# Patient Record
Sex: Male | Born: 1935 | Race: White | Hispanic: No | Marital: Married | Smoking: Smoker, current status unknown
Health system: Southern US, Community
[De-identification: ages and names within clinical notes are randomized; demographics above are authoritative.]

## PROBLEM LIST (undated history)

## (undated) DIAGNOSIS — C801 Malignant (primary) neoplasm, unspecified: Secondary | ICD-10-CM

## (undated) DIAGNOSIS — J189 Pneumonia, unspecified organism: Secondary | ICD-10-CM

## (undated) DIAGNOSIS — C4490 Unspecified malignant neoplasm of skin, unspecified: Secondary | ICD-10-CM

## (undated) DIAGNOSIS — C49A0 Gastrointestinal stromal tumor, unspecified site: Secondary | ICD-10-CM

## (undated) DIAGNOSIS — C719 Malignant neoplasm of brain, unspecified: Secondary | ICD-10-CM

## (undated) DIAGNOSIS — I2699 Other pulmonary embolism without acute cor pulmonale: Secondary | ICD-10-CM

## (undated) HISTORY — PX: OTHER SURGICAL HISTORY: SHX169

## (undated) HISTORY — PX: MOLE REMOVAL: SHX2046

## (undated) HISTORY — PX: ESOPHAGOGASTRODUODENOSCOPY ENDOSCOPY: SHX5814

## (undated) HISTORY — PX: VASECTOMY: SHX75

---

## 2012-06-02 ENCOUNTER — Encounter: Payer: Self-pay | Admitting: Internal Medicine

## 2012-06-28 ENCOUNTER — Encounter: Payer: Self-pay | Admitting: Internal Medicine

## 2012-06-28 LAB — CBC WITH DIFFERENTIAL/PLATELET
Comment - H1-Com2: NORMAL
Eosinophil: 2 %
HGB: 9.8 g/dL — ABNORMAL LOW (ref 13.0–18.0)
Lymphocytes: 29 %
MCH: 35.2 pg — ABNORMAL HIGH (ref 26.0–34.0)
MCV: 100 fL (ref 80–100)
Monocytes: 9 %
Platelet: 31 10*3/uL — ABNORMAL LOW (ref 150–440)
RBC: 2.8 10*6/uL — ABNORMAL LOW (ref 4.40–5.90)
WBC: 1.7 10*3/uL — CL (ref 3.8–10.6)

## 2012-06-28 LAB — CLOSTRIDIUM DIFFICILE BY PCR

## 2012-06-30 LAB — CBC WITH DIFFERENTIAL/PLATELET
Basophil #: 0 10*3/uL (ref 0.0–0.1)
Basophil %: 0.4 %
Eosinophil #: 0.1 10*3/uL (ref 0.0–0.7)
Eosinophil %: 2.2 %
HCT: 27.8 % — ABNORMAL LOW (ref 40.0–52.0)
Lymphocyte %: 24.9 %
MCHC: 34.7 g/dL (ref 32.0–36.0)
MCV: 100 fL (ref 80–100)
Monocyte %: 8 %
Neutrophil #: 1.8 10*3/uL (ref 1.4–6.5)
RDW: 19.1 % — ABNORMAL HIGH (ref 11.5–14.5)

## 2012-07-06 LAB — CBC WITH DIFFERENTIAL/PLATELET
Eosinophil: 3 %
HGB: 9 g/dL — ABNORMAL LOW (ref 13.0–18.0)
MCH: 34.7 pg — ABNORMAL HIGH (ref 26.0–34.0)
MCV: 99 fL (ref 80–100)
Monocytes: 9 %
Myelocyte: 3 %
Other Cells Blood: 2
RBC: 2.59 10*6/uL — ABNORMAL LOW (ref 4.40–5.90)

## 2012-07-07 ENCOUNTER — Inpatient Hospital Stay: Payer: Self-pay | Admitting: Internal Medicine

## 2012-07-07 LAB — URINALYSIS, COMPLETE
Bacteria: NONE SEEN
Blood: NEGATIVE
Glucose,UR: NEGATIVE mg/dL (ref 0–75)
Nitrite: NEGATIVE
Protein: NEGATIVE
Specific Gravity: 1.011 (ref 1.003–1.030)
Squamous Epithelial: NONE SEEN

## 2012-07-07 LAB — COMPREHENSIVE METABOLIC PANEL
Albumin: 2.3 g/dL — ABNORMAL LOW (ref 3.4–5.0)
Alkaline Phosphatase: 118 U/L (ref 50–136)
Anion Gap: 10 (ref 7–16)
BUN: 31 mg/dL — ABNORMAL HIGH (ref 7–18)
Bilirubin,Total: 0.3 mg/dL (ref 0.2–1.0)
EGFR (Non-African Amer.): 40 — ABNORMAL LOW
Glucose: 153 mg/dL — ABNORMAL HIGH (ref 65–99)
Osmolality: 282 (ref 275–301)
Potassium: 4.1 mmol/L (ref 3.5–5.1)
SGOT(AST): 24 U/L (ref 15–37)
SGPT (ALT): 17 U/L (ref 12–78)
Sodium: 136 mmol/L (ref 136–145)
Total Protein: 6.4 g/dL (ref 6.4–8.2)

## 2012-07-07 LAB — CBC WITH DIFFERENTIAL/PLATELET
Bands: 9 %
Basophil: 0 %
Blast: 0 %
Eosinophil: 2 %
Eosinophil: 1 %
HCT: 26.8 % — ABNORMAL LOW (ref 40.0–52.0)
HGB: 9.2 g/dL — ABNORMAL LOW (ref 13.0–18.0)
HGB: 8.4 g/dL — ABNORMAL LOW (ref 13.0–18.0)
Lymphocytes: 9 %
MCHC: 34.4 g/dL (ref 32.0–36.0)
MCHC: 34.5 g/dL (ref 32.0–36.0)
MCV: 99 fL (ref 80–100)
Metamyelocyte: 2 %
Metamyelocyte: 2 %
Monocytes: 19 %
Monocytes: 15 %
NRBC/100 WBC: 0 /
Platelet: 196 10*3/uL (ref 150–440)
Promyelocyte: 0 %
RBC: 2.7 10*6/uL — ABNORMAL LOW (ref 4.40–5.90)
RBC: 2.46 10*6/uL — ABNORMAL LOW (ref 4.40–5.90)
RDW: 19.4 % — ABNORMAL HIGH (ref 11.5–14.5)
Segmented Neutrophils: 61 %
Segmented Neutrophils: 60 %
Variant Lymphocyte - H1-Rlymph: 1 %
Variant Lymphocyte - H1-Rlymph: 4 %
WBC: 7.3 10*3/uL (ref 3.8–10.6)
WBC: 8.6 10*3/uL (ref 3.8–10.6)

## 2012-07-07 LAB — LIPASE, BLOOD: Lipase: 89 U/L (ref 73–393)

## 2012-07-07 LAB — SEDIMENTATION RATE: Erythrocyte Sed Rate: >140 mm/hr — ABNORMAL HIGH (ref 0–20)

## 2012-07-07 LAB — TSH: Thyroid Stimulating Horm: 2.07 u[IU]/mL

## 2012-07-07 LAB — TROPONIN I: Troponin-I: 0.13 ng/mL — ABNORMAL HIGH

## 2012-07-08 LAB — TROPONIN I
Troponin-I: 0.03 ng/mL
Troponin-I: 0.1 ng/mL — ABNORMAL HIGH

## 2012-07-08 LAB — COMPREHENSIVE METABOLIC PANEL
Anion Gap: 8 (ref 7–16)
BUN: 26 mg/dL — ABNORMAL HIGH (ref 7–18)
Bilirubin,Total: 0.3 mg/dL (ref 0.2–1.0)
Chloride: 106 mmol/L (ref 98–107)
Co2: 23 mmol/L (ref 21–32)
EGFR (African American): 46 — ABNORMAL LOW
EGFR (Non-African Amer.): 40 — ABNORMAL LOW
Potassium: 3.9 mmol/L (ref 3.5–5.1)
SGOT(AST): 23 U/L (ref 15–37)
SGPT (ALT): 15 U/L (ref 12–78)
Total Protein: 5.7 g/dL — ABNORMAL LOW (ref 6.4–8.2)

## 2012-07-08 LAB — CK TOTAL AND CKMB (NOT AT ARMC)
CK, Total: 33 U/L — ABNORMAL LOW (ref 35–232)
CK-MB: 1.2 ng/mL (ref 0.5–3.6)

## 2012-07-08 LAB — CBC WITH DIFFERENTIAL/PLATELET
Basophil #: 0 10*3/uL (ref 0.0–0.1)
Basophil %: 0.4 %
Eosinophil %: 0.8 %
HCT: 21 % — ABNORMAL LOW (ref 40.0–52.0)
Lymphocyte #: 0.8 10*3/uL — ABNORMAL LOW (ref 1.0–3.6)
Lymphocyte %: 10.7 %
Monocyte %: 17.6 %
Neutrophil %: 70.5 %
Platelet: 201 10*3/uL (ref 150–440)
RBC: 2.12 10*6/uL — ABNORMAL LOW (ref 4.40–5.90)
RDW: 19.4 % — ABNORMAL HIGH (ref 11.5–14.5)
WBC: 7 10*3/uL (ref 3.8–10.6)

## 2012-07-09 LAB — CBC WITH DIFFERENTIAL/PLATELET
Basophil #: 0 10*3/uL (ref 0.0–0.1)
Basophil %: 0.4 %
Eosinophil %: 2.1 %
HGB: 6.9 g/dL — ABNORMAL LOW (ref 13.0–18.0)
Lymphocyte #: 0.6 10*3/uL — ABNORMAL LOW (ref 1.0–3.6)
Lymphocyte %: 10 %
MCH: 35 pg — ABNORMAL HIGH (ref 26.0–34.0)
MCV: 100 fL (ref 80–100)
Monocyte %: 13.6 %
Neutrophil %: 73.9 %
RBC: 1.98 10*6/uL — ABNORMAL LOW (ref 4.40–5.90)
RDW: 19.4 % — ABNORMAL HIGH (ref 11.5–14.5)
WBC: 6.1 10*3/uL (ref 3.8–10.6)

## 2012-07-09 LAB — BASIC METABOLIC PANEL
Calcium, Total: 8.5 mg/dL (ref 8.5–10.1)
Chloride: 108 mmol/L — ABNORMAL HIGH (ref 98–107)
Co2: 24 mmol/L (ref 21–32)
Creatinine: 1.51 mg/dL — ABNORMAL HIGH (ref 0.60–1.30)
EGFR (African American): 51 — ABNORMAL LOW
Potassium: 4 mmol/L (ref 3.5–5.1)
Sodium: 139 mmol/L (ref 136–145)

## 2012-07-09 LAB — IRON AND TIBC
Iron Bind.Cap.(Total): 152 ug/dL — ABNORMAL LOW (ref 250–450)
Iron Saturation: 28 %
Unbound Iron-Bind.Cap.: 110 ug/dL

## 2012-07-09 LAB — URINE CULTURE

## 2012-07-10 ENCOUNTER — Encounter: Payer: Self-pay | Admitting: Internal Medicine

## 2012-07-10 LAB — VANCOMYCIN, TROUGH: Vancomycin, Trough: 13 ug/mL (ref 10–20)

## 2012-07-10 LAB — BASIC METABOLIC PANEL
BUN: 17 mg/dL (ref 7–18)
Calcium, Total: 8.3 mg/dL — ABNORMAL LOW (ref 8.5–10.1)
Creatinine: 1.43 mg/dL — ABNORMAL HIGH (ref 0.60–1.30)
EGFR (African American): 55 — ABNORMAL LOW
Glucose: 97 mg/dL (ref 65–99)
Osmolality: 279 (ref 275–301)
Potassium: 4 mmol/L (ref 3.5–5.1)
Sodium: 139 mmol/L (ref 136–145)

## 2012-07-10 LAB — CBC WITH DIFFERENTIAL/PLATELET
Bands: 3 %
Eosinophil: 3 %
HGB: 7.3 g/dL — ABNORMAL LOW (ref 13.0–18.0)
MCH: 34.9 pg — ABNORMAL HIGH (ref 26.0–34.0)
MCHC: 35.2 g/dL (ref 32.0–36.0)
Monocytes: 7 %
RDW: 19.2 % — ABNORMAL HIGH (ref 11.5–14.5)
Segmented Neutrophils: 78 %

## 2012-07-11 LAB — CBC WITH DIFFERENTIAL/PLATELET
Basophil #: 0 10*3/uL (ref 0.0–0.1)
Basophil %: 0.4 %
Eosinophil #: 0.1 10*3/uL (ref 0.0–0.7)
HCT: 23.4 % — ABNORMAL LOW (ref 40.0–52.0)
MCH: 33.6 pg (ref 26.0–34.0)
MCHC: 35.2 g/dL (ref 32.0–36.0)
Monocyte #: 1 x10 3/mm (ref 0.2–1.0)
Monocyte %: 15 %
Neutrophil #: 4.9 10*3/uL (ref 1.4–6.5)
Neutrophil %: 72.2 %
Platelet: 284 10*3/uL (ref 150–440)
RDW: 20.2 % — ABNORMAL HIGH (ref 11.5–14.5)
WBC: 6.7 10*3/uL (ref 3.8–10.6)

## 2012-07-11 LAB — BASIC METABOLIC PANEL
Anion Gap: 10 (ref 7–16)
Calcium, Total: 8.6 mg/dL (ref 8.5–10.1)
Chloride: 108 mmol/L — ABNORMAL HIGH (ref 98–107)
Co2: 21 mmol/L (ref 21–32)
EGFR (Non-African Amer.): 47 — ABNORMAL LOW
Glucose: 96 mg/dL (ref 65–99)
Osmolality: 279 (ref 275–301)
Potassium: 3.6 mmol/L (ref 3.5–5.1)
Sodium: 139 mmol/L (ref 136–145)

## 2012-07-12 LAB — BASIC METABOLIC PANEL
Anion Gap: 10 (ref 7–16)
Calcium, Total: 8.5 mg/dL (ref 8.5–10.1)
Chloride: 107 mmol/L (ref 98–107)
Co2: 22 mmol/L (ref 21–32)
Creatinine: 1.35 mg/dL — ABNORMAL HIGH (ref 0.60–1.30)
EGFR (African American): 59 — ABNORMAL LOW
EGFR (Non-African Amer.): 51 — ABNORMAL LOW
Glucose: 106 mg/dL — ABNORMAL HIGH (ref 65–99)
Osmolality: 279 (ref 275–301)
Potassium: 3.5 mmol/L (ref 3.5–5.1)
Sodium: 139 mmol/L (ref 136–145)

## 2012-07-12 LAB — CBC WITH DIFFERENTIAL/PLATELET
Basophil %: 0.3 %
Eosinophil %: 1.7 %
HCT: 23.6 % — ABNORMAL LOW (ref 40.0–52.0)
HGB: 8.1 g/dL — ABNORMAL LOW (ref 13.0–18.0)
Lymphocyte %: 8.5 %
MCH: 32.9 pg (ref 26.0–34.0)
MCHC: 34.2 g/dL (ref 32.0–36.0)
Monocyte %: 14.5 %
Neutrophil #: 6.1 10*3/uL (ref 1.4–6.5)
Neutrophil %: 75 %
RBC: 2.46 10*6/uL — ABNORMAL LOW (ref 4.40–5.90)

## 2012-07-13 LAB — CULTURE, BLOOD (SINGLE)

## 2012-07-22 ENCOUNTER — Encounter: Payer: Self-pay | Admitting: Internal Medicine

## 2012-07-26 LAB — CBC WITH DIFFERENTIAL/PLATELET
Basophil #: 0 10*3/uL (ref 0.0–0.1)
Basophil %: 0.3 %
Eosinophil #: 0.5 10*3/uL (ref 0.0–0.7)
Eosinophil %: 9.1 %
HCT: 30 % — ABNORMAL LOW (ref 40.0–52.0)
HGB: 10.4 g/dL — ABNORMAL LOW (ref 13.0–18.0)
Lymphocyte #: 0.4 10*3/uL — ABNORMAL LOW (ref 1.0–3.6)
Lymphocyte %: 8.5 %
MCH: 32.2 pg (ref 26.0–34.0)
MCHC: 34.6 g/dL (ref 32.0–36.0)
Neutrophil #: 4 10*3/uL (ref 1.4–6.5)
RDW: 17.8 % — ABNORMAL HIGH (ref 11.5–14.5)

## 2012-07-26 LAB — BASIC METABOLIC PANEL
BUN: 18 mg/dL (ref 7–18)
Co2: 24 mmol/L (ref 21–32)
Glucose: 113 mg/dL — ABNORMAL HIGH (ref 65–99)
Osmolality: 280 (ref 275–301)
Potassium: 3.4 mmol/L — ABNORMAL LOW (ref 3.5–5.1)
Sodium: 139 mmol/L (ref 136–145)

## 2012-07-29 LAB — BASIC METABOLIC PANEL
Anion Gap: 14 (ref 7–16)
Calcium, Total: 9.3 mg/dL (ref 8.5–10.1)
Co2: 20 mmol/L — ABNORMAL LOW (ref 21–32)
Creatinine: 1.22 mg/dL (ref 0.60–1.30)
EGFR (African American): >60
EGFR (Non-African Amer.): 57 — ABNORMAL LOW
Glucose: 113 mg/dL — ABNORMAL HIGH (ref 65–99)
Osmolality: 281 (ref 275–301)
Sodium: 140 mmol/L (ref 136–145)

## 2012-07-29 LAB — CBC WITH DIFFERENTIAL/PLATELET
Basophil %: 0.7 %
Eosinophil #: 0.5 10*3/uL (ref 0.0–0.7)
Eosinophil %: 11.9 %
HCT: 29.6 % — ABNORMAL LOW (ref 40.0–52.0)
Lymphocyte %: 10.8 %
MCHC: 34.2 g/dL (ref 32.0–36.0)
MCV: 95 fL (ref 80–100)
Monocyte %: 11.3 %
Neutrophil %: 65.3 %
Platelet: 61 10*3/uL — ABNORMAL LOW (ref 150–440)
RDW: 18.7 % — ABNORMAL HIGH (ref 11.5–14.5)
WBC: 4.5 10*3/uL (ref 3.8–10.6)

## 2012-08-02 LAB — CBC WITH DIFFERENTIAL/PLATELET
Bands: 2 %
Basophil: 1 %
MCH: 32.1 pg (ref 26.0–34.0)
MCHC: 34.1 g/dL (ref 32.0–36.0)
Monocytes: 9 %
Myelocyte: 1 %
RDW: 19.3 % — ABNORMAL HIGH (ref 11.5–14.5)
Segmented Neutrophils: 38 %

## 2012-08-02 LAB — BASIC METABOLIC PANEL
Anion Gap: 9 (ref 7–16)
BUN: 19 mg/dL — ABNORMAL HIGH (ref 7–18)
Calcium, Total: 9.6 mg/dL (ref 8.5–10.1)
Chloride: 106 mmol/L (ref 98–107)
Co2: 24 mmol/L (ref 21–32)
EGFR (African American): >60
EGFR (Non-African Amer.): >60
Osmolality: 279 (ref 275–301)

## 2012-08-05 LAB — CBC WITH DIFFERENTIAL/PLATELET
Bands: 1 %
MCH: 32.3 pg (ref 26.0–34.0)
MCHC: 34.2 g/dL (ref 32.0–36.0)
Platelet: 204 10*3/uL (ref 150–440)
RDW: 19.4 % — ABNORMAL HIGH (ref 11.5–14.5)
Segmented Neutrophils: 41 %
Variant Lymphocyte - H1-Rlymph: 15 %
WBC: 4 10*3/uL (ref 3.8–10.6)

## 2012-08-05 LAB — BASIC METABOLIC PANEL
Calcium, Total: 9.7 mg/dL (ref 8.5–10.1)
Creatinine: 1.17 mg/dL (ref 0.60–1.30)
EGFR (Non-African Amer.): >60
Glucose: 102 mg/dL — ABNORMAL HIGH (ref 65–99)
Osmolality: 280 (ref 275–301)
Potassium: 3.9 mmol/L (ref 3.5–5.1)
Sodium: 139 mmol/L (ref 136–145)

## 2012-08-09 ENCOUNTER — Encounter: Payer: Self-pay | Admitting: Internal Medicine

## 2012-08-09 LAB — CBC WITH DIFFERENTIAL/PLATELET
MCH: 32.5 pg (ref 26.0–34.0)
MCHC: 34 g/dL (ref 32.0–36.0)
MCV: 95 fL (ref 80–100)
Metamyelocyte: 1 %
Platelet: 213 10*3/uL (ref 150–440)
RDW: 20.1 % — ABNORMAL HIGH (ref 11.5–14.5)
Segmented Neutrophils: 62 %

## 2012-08-09 LAB — BASIC METABOLIC PANEL
BUN: 20 mg/dL — ABNORMAL HIGH (ref 7–18)
Calcium, Total: 9.8 mg/dL (ref 8.5–10.1)
Chloride: 105 mmol/L (ref 98–107)
Co2: 24 mmol/L (ref 21–32)
EGFR (African American): >60
Potassium: 3.9 mmol/L (ref 3.5–5.1)
Sodium: 140 mmol/L (ref 136–145)

## 2012-08-10 ENCOUNTER — Inpatient Hospital Stay: Payer: Self-pay | Admitting: Internal Medicine

## 2012-08-10 LAB — CBC WITH DIFFERENTIAL/PLATELET
Basophil #: 0.1 10*3/uL (ref 0.0–0.1)
Lymphocyte #: 1.1 10*3/uL (ref 1.0–3.6)
Lymphocyte %: 14.5 %
MCHC: 34.9 g/dL (ref 32.0–36.0)
MCV: 95 fL (ref 80–100)
Monocyte %: 15.1 %
Neutrophil %: 66.1 %
Platelet: 219 10*3/uL (ref 150–440)
RBC: 3.02 10*6/uL — ABNORMAL LOW (ref 4.40–5.90)
RDW: 20 % — ABNORMAL HIGH (ref 11.5–14.5)
WBC: 7.5 10*3/uL (ref 3.8–10.6)

## 2012-08-10 LAB — COMPREHENSIVE METABOLIC PANEL
BUN: 20 mg/dL — ABNORMAL HIGH (ref 7–18)
Bilirubin,Total: 0.3 mg/dL (ref 0.2–1.0)
Chloride: 109 mmol/L — ABNORMAL HIGH (ref 98–107)
Co2: 23 mmol/L (ref 21–32)
Creatinine: 1.05 mg/dL (ref 0.60–1.30)
EGFR (African American): >60
EGFR (Non-African Amer.): >60
Glucose: 114 mg/dL — ABNORMAL HIGH (ref 65–99)
Osmolality: 283 (ref 275–301)
Potassium: 4.1 mmol/L (ref 3.5–5.1)
SGPT (ALT): 21 U/L (ref 12–78)
Sodium: 140 mmol/L (ref 136–145)
Total Protein: 6 g/dL — ABNORMAL LOW (ref 6.4–8.2)

## 2012-08-10 LAB — CK TOTAL AND CKMB (NOT AT ARMC): CK, Total: 22 U/L — ABNORMAL LOW (ref 35–232)

## 2012-08-10 LAB — PROTIME-INR: INR: 1.8

## 2012-08-11 LAB — BASIC METABOLIC PANEL
Anion Gap: 10 (ref 7–16)
BUN: 18 mg/dL (ref 7–18)
Calcium, Total: 8.6 mg/dL (ref 8.5–10.1)
Chloride: 108 mmol/L — ABNORMAL HIGH (ref 98–107)
Co2: 23 mmol/L (ref 21–32)
Creatinine: 1.28 mg/dL (ref 0.60–1.30)
EGFR (African American): >60
Potassium: 4.4 mmol/L (ref 3.5–5.1)

## 2012-08-11 LAB — CBC WITH DIFFERENTIAL/PLATELET
Basophil #: 0 10*3/uL (ref 0.0–0.1)
Basophil %: 0.4 %
Eosinophil %: 5.3 %
HCT: 26.9 % — ABNORMAL LOW (ref 40.0–52.0)
HGB: 9.7 g/dL — ABNORMAL LOW (ref 13.0–18.0)
Lymphocyte #: 0.9 10*3/uL — ABNORMAL LOW (ref 1.0–3.6)
MCH: 34 pg (ref 26.0–34.0)
MCV: 95 fL (ref 80–100)
Monocyte %: 17.1 %
Neutrophil %: 60.2 %
Platelet: 211 10*3/uL (ref 150–440)
RBC: 2.85 10*6/uL — ABNORMAL LOW (ref 4.40–5.90)
WBC: 5.3 10*3/uL (ref 3.8–10.6)

## 2012-08-11 LAB — APTT: Activated PTT: 79 secs — ABNORMAL HIGH (ref 23.6–35.9)

## 2012-08-11 LAB — PROTIME-INR
INR: 1
Prothrombin Time: 13.8 secs (ref 11.5–14.7)

## 2012-08-12 LAB — APTT: Activated PTT: 86.8 secs — ABNORMAL HIGH (ref 23.6–35.9)

## 2012-08-16 LAB — CBC WITH DIFFERENTIAL/PLATELET
Basophil %: 0.8 %
Eosinophil #: 0.2 10*3/uL (ref 0.0–0.7)
Eosinophil %: 3.7 %
HGB: 10.2 g/dL — ABNORMAL LOW (ref 13.0–18.0)
Lymphocyte #: 0.8 10*3/uL — ABNORMAL LOW (ref 1.0–3.6)
MCH: 32.7 pg (ref 26.0–34.0)
MCHC: 34.3 g/dL (ref 32.0–36.0)
MCV: 95 fL (ref 80–100)
Monocyte #: 0.7 x10 3/mm (ref 0.2–1.0)
Platelet: 314 10*3/uL (ref 150–440)
RBC: 3.12 10*6/uL — ABNORMAL LOW (ref 4.40–5.90)

## 2012-08-23 LAB — PROTIME-INR
INR: 1.4
Prothrombin Time: 17.8 secs — ABNORMAL HIGH (ref 11.5–14.7)

## 2012-08-23 LAB — BASIC METABOLIC PANEL
BUN: 20 mg/dL — ABNORMAL HIGH (ref 7–18)
Chloride: 107 mmol/L (ref 98–107)
Creatinine: 1.1 mg/dL (ref 0.60–1.30)
EGFR (African American): >60
EGFR (Non-African Amer.): >60
Glucose: 82 mg/dL (ref 65–99)
Osmolality: 285 (ref 275–301)
Potassium: 3.9 mmol/L (ref 3.5–5.1)
Sodium: 142 mmol/L (ref 136–145)

## 2012-08-24 LAB — CBC WITH DIFFERENTIAL/PLATELET
Basophil #: 0 10*3/uL (ref 0.0–0.1)
Basophil %: 0.7 %
Eosinophil #: 0.5 10*3/uL (ref 0.0–0.7)
Eosinophil %: 8 %
HCT: 31.2 % — ABNORMAL LOW (ref 40.0–52.0)
HGB: 10.5 g/dL — ABNORMAL LOW (ref 13.0–18.0)
Lymphocyte #: 0.8 10*3/uL — ABNORMAL LOW (ref 1.0–3.6)
Lymphocyte %: 13.9 %
MCH: 32.5 pg (ref 26.0–34.0)
MCHC: 33.5 g/dL (ref 32.0–36.0)
MCV: 97 fL (ref 80–100)
Monocyte #: 0.7 x10 3/mm (ref 0.2–1.0)
Monocyte %: 11.1 %
Neutrophil #: 4 10*3/uL (ref 1.4–6.5)
Neutrophil %: 66.3 %
Platelet: 289 10*3/uL (ref 150–440)
RBC: 3.23 10*6/uL — ABNORMAL LOW (ref 4.40–5.90)
RDW: 19.4 % — ABNORMAL HIGH (ref 11.5–14.5)
WBC: 6 10*3/uL (ref 3.8–10.6)

## 2012-08-26 LAB — CBC WITH DIFFERENTIAL/PLATELET
HCT: 31.7 % — ABNORMAL LOW (ref 40.0–52.0)
Lymphocytes: 16 %
MCH: 33.4 pg (ref 26.0–34.0)
MCHC: 34.2 g/dL (ref 32.0–36.0)
MCV: 98 fL (ref 80–100)
Monocytes: 13 %
Platelet: 274 10*3/uL (ref 150–440)
RDW: 19.5 % — ABNORMAL HIGH (ref 11.5–14.5)
Segmented Neutrophils: 54 %
Variant Lymphocyte - H1-Rlymph: 4 %
WBC: 5.1 10*3/uL (ref 3.8–10.6)

## 2012-08-26 LAB — BASIC METABOLIC PANEL
Anion Gap: 13 (ref 7–16)
BUN: 20 mg/dL — ABNORMAL HIGH (ref 7–18)
Chloride: 104 mmol/L (ref 98–107)
Creatinine: 1.2 mg/dL (ref 0.60–1.30)
EGFR (African American): >60
EGFR (Non-African Amer.): 58 — ABNORMAL LOW
Glucose: 68 mg/dL (ref 65–99)

## 2012-08-31 ENCOUNTER — Inpatient Hospital Stay: Payer: Self-pay | Admitting: Internal Medicine

## 2012-08-31 LAB — COMPREHENSIVE METABOLIC PANEL
Albumin: 2.9 g/dL — ABNORMAL LOW (ref 3.4–5.0)
Anion Gap: 11 (ref 7–16)
BUN: 26 mg/dL — ABNORMAL HIGH (ref 7–18)
Calcium, Total: 8.2 mg/dL — ABNORMAL LOW (ref 8.5–10.1)
Co2: 20 mmol/L — ABNORMAL LOW (ref 21–32)
EGFR (Non-African Amer.): >60
Glucose: 118 mg/dL — ABNORMAL HIGH (ref 65–99)
Osmolality: 295 (ref 275–301)
Potassium: 3.7 mmol/L (ref 3.5–5.1)
SGOT(AST): 41 U/L — ABNORMAL HIGH (ref 15–37)
Sodium: 145 mmol/L (ref 136–145)
Total Protein: 6 g/dL — ABNORMAL LOW (ref 6.4–8.2)

## 2012-08-31 LAB — LIPASE, BLOOD: Lipase: 110 U/L (ref 73–393)

## 2012-08-31 LAB — CBC WITH DIFFERENTIAL/PLATELET
Basophil #: 0.1 10*3/uL (ref 0.0–0.1)
Basophil %: 1.1 %
Eosinophil #: 0 10*3/uL (ref 0.0–0.7)
HGB: 9.9 g/dL — ABNORMAL LOW (ref 13.0–18.0)
Lymphocyte %: 6.7 %
MCH: 33.5 pg (ref 26.0–34.0)
MCHC: 33.5 g/dL (ref 32.0–36.0)
Monocyte %: 2 %
Neutrophil %: 90.2 %
Platelet: 207 10*3/uL (ref 150–440)
WBC: 6.6 10*3/uL (ref 3.8–10.6)

## 2012-08-31 LAB — TROPONIN I: Troponin-I: 0.04 ng/mL

## 2012-08-31 LAB — PRO B NATRIURETIC PEPTIDE: B-Type Natriuretic Peptide: 5546 pg/mL — ABNORMAL HIGH (ref 0–450)

## 2012-09-01 LAB — BASIC METABOLIC PANEL
Anion Gap: 12 (ref 7–16)
BUN: 28 mg/dL — ABNORMAL HIGH (ref 7–18)
Calcium, Total: 8.6 mg/dL (ref 8.5–10.1)
Chloride: 110 mmol/L — ABNORMAL HIGH (ref 98–107)
Co2: 20 mmol/L — ABNORMAL LOW (ref 21–32)
Creatinine: 1.07 mg/dL (ref 0.60–1.30)
Osmolality: 288 (ref 275–301)
Potassium: 3.2 mmol/L — ABNORMAL LOW (ref 3.5–5.1)
Sodium: 142 mmol/L (ref 136–145)

## 2012-09-01 LAB — CBC WITH DIFFERENTIAL/PLATELET
Basophil %: 0.4 %
Eosinophil #: 0.2 10*3/uL (ref 0.0–0.7)
Eosinophil %: 4.2 %
HCT: 32.2 % — ABNORMAL LOW (ref 40.0–52.0)
HGB: 11 g/dL — ABNORMAL LOW (ref 13.0–18.0)
MCH: 33.2 pg (ref 26.0–34.0)
Monocyte %: 2 %
Neutrophil %: 82.7 %
Platelet: 202 10*3/uL (ref 150–440)
RBC: 3.31 10*6/uL — ABNORMAL LOW (ref 4.40–5.90)
RDW: 18.7 % — ABNORMAL HIGH (ref 11.5–14.5)
WBC: 5.8 10*3/uL (ref 3.8–10.6)

## 2012-09-01 LAB — LIPID PANEL
Cholesterol: 194 mg/dL (ref 0–200)
HDL Cholesterol: 34 mg/dL — ABNORMAL LOW (ref 40–60)
Triglycerides: 300 mg/dL — ABNORMAL HIGH (ref 0–200)

## 2012-09-02 LAB — CBC WITH DIFFERENTIAL/PLATELET
Basophil #: 0 10*3/uL (ref 0.0–0.1)
Eosinophil #: 0.3 10*3/uL (ref 0.0–0.7)
HCT: 31.5 % — ABNORMAL LOW (ref 40.0–52.0)
HGB: 10.7 g/dL — ABNORMAL LOW (ref 13.0–18.0)
Lymphocyte #: 0.6 10*3/uL — ABNORMAL LOW (ref 1.0–3.6)
MCH: 33 pg (ref 26.0–34.0)
MCV: 97 fL (ref 80–100)
Monocyte #: 0.2 x10 3/mm (ref 0.2–1.0)
Neutrophil %: 68.8 %
Platelet: 172 10*3/uL (ref 150–440)
RBC: 3.26 10*6/uL — ABNORMAL LOW (ref 4.40–5.90)
RDW: 18.2 % — ABNORMAL HIGH (ref 11.5–14.5)
WBC: 3.7 10*3/uL — ABNORMAL LOW (ref 3.8–10.6)

## 2012-09-02 LAB — BASIC METABOLIC PANEL
Anion Gap: 10 (ref 7–16)
BUN: 28 mg/dL — ABNORMAL HIGH (ref 7–18)
Calcium, Total: 8.7 mg/dL (ref 8.5–10.1)
Chloride: 109 mmol/L — ABNORMAL HIGH (ref 98–107)
Co2: 22 mmol/L (ref 21–32)
Creatinine: 1.14 mg/dL (ref 0.60–1.30)
EGFR (African American): >60
Potassium: 3.4 mmol/L — ABNORMAL LOW (ref 3.5–5.1)
Sodium: 141 mmol/L (ref 136–145)

## 2012-09-02 LAB — MAGNESIUM: Magnesium: 1.7 mg/dL — ABNORMAL LOW

## 2012-09-06 LAB — CBC WITH DIFFERENTIAL/PLATELET
Basophil #: 0 10*3/uL (ref 0.0–0.1)
Eosinophil #: 0.4 10*3/uL (ref 0.0–0.7)
Eosinophil %: 10.4 %
HCT: 33 % — ABNORMAL LOW (ref 40.0–52.0)
Lymphocyte %: 19 %
MCHC: 34.9 g/dL (ref 32.0–36.0)
Monocyte %: 13.1 %
Neutrophil #: 2.3 10*3/uL (ref 1.4–6.5)
Neutrophil %: 57.2 %
Platelet: 106 10*3/uL — ABNORMAL LOW (ref 150–440)
RDW: 17.8 % — ABNORMAL HIGH (ref 11.5–14.5)
WBC: 4 10*3/uL (ref 3.8–10.6)

## 2012-09-08 LAB — CBC WITH DIFFERENTIAL/PLATELET
Basophil #: 0 10*3/uL (ref 0.0–0.1)
Basophil %: 0.6 %
Eosinophil #: 0.3 10*3/uL (ref 0.0–0.7)
Eosinophil %: 7.5 %
HGB: 11.3 g/dL — ABNORMAL LOW (ref 13.0–18.0)
Lymphocyte %: 19.3 %
MCH: 33.5 pg (ref 26.0–34.0)
Monocyte %: 12.6 %
Neutrophil %: 60 %
Platelet: 117 10*3/uL — ABNORMAL LOW (ref 150–440)
RBC: 3.38 10*6/uL — ABNORMAL LOW (ref 4.40–5.90)
WBC: 4 10*3/uL (ref 3.8–10.6)

## 2012-09-08 LAB — BASIC METABOLIC PANEL
BUN: 30 mg/dL — ABNORMAL HIGH (ref 7–18)
Calcium, Total: 9.3 mg/dL (ref 8.5–10.1)
EGFR (Non-African Amer.): 57 — ABNORMAL LOW
Glucose: 90 mg/dL (ref 65–99)
Potassium: 4 mmol/L (ref 3.5–5.1)
Sodium: 142 mmol/L (ref 136–145)

## 2012-09-09 ENCOUNTER — Encounter: Payer: Self-pay | Admitting: Internal Medicine

## 2012-09-13 LAB — CBC WITH DIFFERENTIAL/PLATELET
Lymphocytes: 14 %
Monocytes: 13 %
Platelet: 214 10*3/uL (ref 150–440)
RBC: 3.76 10*6/uL — ABNORMAL LOW (ref 4.40–5.90)
RDW: 17.9 % — ABNORMAL HIGH (ref 11.5–14.5)
Segmented Neutrophils: 39 %
Variant Lymphocyte - H1-Rlymph: 21 %
WBC: 2.7 10*3/uL — ABNORMAL LOW (ref 3.8–10.6)

## 2012-09-13 LAB — BASIC METABOLIC PANEL
Chloride: 106 mmol/L (ref 98–107)
Co2: 25 mmol/L (ref 21–32)
EGFR (African American): 59 — ABNORMAL LOW
Glucose: 108 mg/dL — ABNORMAL HIGH (ref 65–99)
Sodium: 142 mmol/L (ref 136–145)

## 2012-09-15 LAB — CBC WITH DIFFERENTIAL/PLATELET
Bands: 2 %
HCT: 33.3 % — ABNORMAL LOW (ref 40.0–52.0)
MCH: 33.3 pg (ref 26.0–34.0)
MCHC: 34.1 g/dL (ref 32.0–36.0)
Platelet: 207 10*3/uL (ref 150–440)
RBC: 3.41 10*6/uL — ABNORMAL LOW (ref 4.40–5.90)
Variant Lymphocyte - H1-Rlymph: 5 %
WBC: 2.7 10*3/uL — ABNORMAL LOW (ref 3.8–10.6)

## 2012-09-15 LAB — BASIC METABOLIC PANEL
Anion Gap: 11 (ref 7–16)
BUN: 27 mg/dL — ABNORMAL HIGH (ref 7–18)
Chloride: 107 mmol/L (ref 98–107)
Creatinine: 1.28 mg/dL (ref 0.60–1.30)
EGFR (Non-African Amer.): 54 — ABNORMAL LOW
Glucose: 82 mg/dL (ref 65–99)
Osmolality: 289 (ref 275–301)
Potassium: 4.2 mmol/L (ref 3.5–5.1)
Sodium: 143 mmol/L (ref 136–145)

## 2012-09-18 ENCOUNTER — Emergency Department: Payer: Self-pay | Admitting: Emergency Medicine

## 2012-09-18 LAB — CBC WITH DIFFERENTIAL/PLATELET
Bands: 13 %
Basophil: 1 %
Eosinophil: 1 %
HGB: 11.8 g/dL — ABNORMAL LOW (ref 13.0–18.0)
MCHC: 34.5 g/dL (ref 32.0–36.0)
MCV: 98 fL (ref 80–100)
Metamyelocyte: 1 %
Monocytes: 12 %
Platelet: 211 10*3/uL (ref 150–440)
RBC: 3.48 10*6/uL — ABNORMAL LOW (ref 4.40–5.90)
Segmented Neutrophils: 57 %
Variant Lymphocyte - H1-Rlymph: 3 %

## 2012-09-18 LAB — URINALYSIS, COMPLETE
Bilirubin,UR: NEGATIVE
Ketone: NEGATIVE
Protein: 30
RBC,UR: 7 /HPF (ref 0–5)
Specific Gravity: 1.013 (ref 1.003–1.030)
Squamous Epithelial: <1
WBC UR: 255 /HPF (ref 0–5)

## 2012-09-18 LAB — COMPREHENSIVE METABOLIC PANEL
Albumin: 3.3 g/dL — ABNORMAL LOW (ref 3.4–5.0)
Anion Gap: 7 (ref 7–16)
Calcium, Total: 9.4 mg/dL (ref 8.5–10.1)
Co2: 26 mmol/L (ref 21–32)
EGFR (Non-African Amer.): 54 — ABNORMAL LOW
Potassium: 4.4 mmol/L (ref 3.5–5.1)
SGOT(AST): 29 U/L (ref 15–37)
SGPT (ALT): 33 U/L (ref 12–78)
Sodium: 134 mmol/L — ABNORMAL LOW (ref 136–145)
Total Protein: 6.8 g/dL (ref 6.4–8.2)

## 2012-09-20 LAB — BASIC METABOLIC PANEL
Anion Gap: 13 (ref 7–16)
Calcium, Total: 9.7 mg/dL (ref 8.5–10.1)
Co2: 21 mmol/L (ref 21–32)
EGFR (African American): 56 — ABNORMAL LOW
EGFR (Non-African Amer.): 48 — ABNORMAL LOW
Glucose: 146 mg/dL — ABNORMAL HIGH (ref 65–99)
Osmolality: 275 (ref 275–301)
Potassium: 3.7 mmol/L (ref 3.5–5.1)

## 2012-09-20 LAB — CBC WITH DIFFERENTIAL/PLATELET
Eosinophil: 5 %
HCT: 34.2 % — ABNORMAL LOW (ref 40.0–52.0)
HGB: 11.6 g/dL — ABNORMAL LOW (ref 13.0–18.0)
MCV: 98 fL (ref 80–100)
Metamyelocyte: 1 %
Monocytes: 8 %
Myelocyte: 1 %
Platelet: 224 10*3/uL (ref 150–440)
RBC: 3.47 10*6/uL — ABNORMAL LOW (ref 4.40–5.90)
Segmented Neutrophils: 72 %

## 2012-09-22 LAB — CBC WITH DIFFERENTIAL/PLATELET
Eosinophil: 2 %
Lymphocytes: 13 %
MCV: 99 fL (ref 80–100)
Metamyelocyte: 5 %
Monocytes: 4 %
Platelet: 260 10*3/uL (ref 150–440)
RBC: 3.61 10*6/uL — ABNORMAL LOW (ref 4.40–5.90)
Variant Lymphocyte - H1-Rlymph: 2 %
WBC: 5.2 10*3/uL (ref 3.8–10.6)

## 2012-09-22 LAB — BASIC METABOLIC PANEL
Anion Gap: 10 (ref 7–16)
Calcium, Total: 10.2 mg/dL — ABNORMAL HIGH (ref 8.5–10.1)
Chloride: 103 mmol/L (ref 98–107)
Co2: 25 mmol/L (ref 21–32)
EGFR (African American): >60
Glucose: 119 mg/dL — ABNORMAL HIGH (ref 65–99)
Osmolality: 279 (ref 275–301)
Sodium: 138 mmol/L (ref 136–145)

## 2012-09-24 LAB — CULTURE, BLOOD (SINGLE)

## 2012-09-29 LAB — URINALYSIS, COMPLETE
Bacteria: NONE SEEN
Blood: NEGATIVE
Leukocyte Esterase: NEGATIVE
Nitrite: NEGATIVE
Ph: 6 (ref 4.5–8.0)
Protein: NEGATIVE
RBC,UR: <1 /HPF (ref 0–5)
Squamous Epithelial: NONE SEEN

## 2012-09-29 LAB — BASIC METABOLIC PANEL
BUN: 24 mg/dL — ABNORMAL HIGH (ref 7–18)
Calcium, Total: 9.9 mg/dL (ref 8.5–10.1)
Chloride: 103 mmol/L (ref 98–107)
Co2: 25 mmol/L (ref 21–32)
Glucose: 97 mg/dL (ref 65–99)
Osmolality: 278 (ref 275–301)
Potassium: 4.4 mmol/L (ref 3.5–5.1)
Sodium: 137 mmol/L (ref 136–145)

## 2012-09-29 LAB — CBC WITH DIFFERENTIAL/PLATELET
Basophil #: 0 10*3/uL (ref 0.0–0.1)
Lymphocyte %: 14.6 %
Monocyte %: 11.1 %
Platelet: 345 10*3/uL (ref 150–440)
RDW: 16.5 % — ABNORMAL HIGH (ref 11.5–14.5)
WBC: 8.5 10*3/uL (ref 3.8–10.6)

## 2012-10-01 LAB — URINE CULTURE

## 2012-10-07 ENCOUNTER — Inpatient Hospital Stay: Payer: Self-pay | Admitting: Internal Medicine

## 2012-10-07 LAB — URINALYSIS, COMPLETE
Bacteria: NONE SEEN
Bilirubin,UR: NEGATIVE
Blood: NEGATIVE
Glucose,UR: NEGATIVE mg/dL (ref 0–75)
Hyaline Cast: 1
Leukocyte Esterase: NEGATIVE
Nitrite: NEGATIVE
Protein: NEGATIVE
RBC,UR: 1 /HPF (ref 0–5)
Specific Gravity: 1.012 (ref 1.003–1.030)
Squamous Epithelial: NONE SEEN
WBC UR: 1 /HPF (ref 0–5)

## 2012-10-07 LAB — CBC WITH DIFFERENTIAL/PLATELET
Bands: 7 %
Eosinophil: 5 %
HCT: 35 % — ABNORMAL LOW (ref 40.0–52.0)
HGB: 11.9 g/dL — ABNORMAL LOW (ref 13.0–18.0)
Lymphocytes: 9 %
MCHC: 34.1 g/dL (ref 32.0–36.0)
MCV: 97 fL (ref 80–100)
Monocytes: 10 %
RBC: 3.62 10*6/uL — ABNORMAL LOW (ref 4.40–5.90)
Segmented Neutrophils: 50 %
Variant Lymphocyte - H1-Rlymph: 12 %

## 2012-10-07 LAB — COMPREHENSIVE METABOLIC PANEL
Albumin: 3.1 g/dL — ABNORMAL LOW (ref 3.4–5.0)
Bilirubin,Total: 0.3 mg/dL (ref 0.2–1.0)
Calcium, Total: 9.7 mg/dL (ref 8.5–10.1)
Chloride: 104 mmol/L (ref 98–107)
Co2: 23 mmol/L (ref 21–32)
Creatinine: 1.3 mg/dL (ref 0.60–1.30)
EGFR (African American): >60
Potassium: 4.3 mmol/L (ref 3.5–5.1)
SGOT(AST): 33 U/L (ref 15–37)
SGPT (ALT): 39 U/L (ref 12–78)
Total Protein: 7.3 g/dL (ref 6.4–8.2)

## 2012-10-07 LAB — PROTIME-INR: INR: 1

## 2012-10-07 LAB — TROPONIN I: Troponin-I: <0.02 ng/mL

## 2012-10-08 LAB — BASIC METABOLIC PANEL
Anion Gap: 11 (ref 7–16)
BUN: 26 mg/dL — ABNORMAL HIGH (ref 7–18)
Calcium, Total: 9.6 mg/dL (ref 8.5–10.1)
Co2: 22 mmol/L (ref 21–32)
EGFR (African American): >60
EGFR (Non-African Amer.): 54 — ABNORMAL LOW
Glucose: 100 mg/dL — ABNORMAL HIGH (ref 65–99)
Potassium: 4.1 mmol/L (ref 3.5–5.1)

## 2012-10-08 LAB — CBC WITH DIFFERENTIAL/PLATELET
Eosinophil: 4 %
HCT: 32.4 % — ABNORMAL LOW (ref 40.0–52.0)
MCH: 33.3 pg (ref 26.0–34.0)
MCV: 98 fL (ref 80–100)
Metamyelocyte: 3 %
Platelet: 250 10*3/uL (ref 150–440)
RDW: 15.8 % — ABNORMAL HIGH (ref 11.5–14.5)
Segmented Neutrophils: 43 %
WBC: 5 10*3/uL (ref 3.8–10.6)

## 2012-10-09 ENCOUNTER — Encounter: Payer: Self-pay | Admitting: Internal Medicine

## 2012-10-09 LAB — CBC WITH DIFFERENTIAL/PLATELET
Eosinophil: 5 %
HCT: 26.6 % — ABNORMAL LOW (ref 40.0–52.0)
HGB: 9.3 g/dL — ABNORMAL LOW (ref 13.0–18.0)
Lymphocytes: 16 %
MCH: 34.1 pg — ABNORMAL HIGH (ref 26.0–34.0)
MCV: 97 fL (ref 80–100)
Metamyelocyte: 4 %
Monocytes: 13 %
Segmented Neutrophils: 52 %
Variant Lymphocyte - H1-Rlymph: 1 %
WBC: 3 10*3/uL — ABNORMAL LOW (ref 3.8–10.6)

## 2012-10-09 LAB — BASIC METABOLIC PANEL
BUN: 20 mg/dL — ABNORMAL HIGH (ref 7–18)
Co2: 22 mmol/L (ref 21–32)
EGFR (African American): >60
EGFR (Non-African Amer.): 54 — ABNORMAL LOW
Glucose: 91 mg/dL (ref 65–99)
Osmolality: 283 (ref 275–301)
Potassium: 3.7 mmol/L (ref 3.5–5.1)
Sodium: 141 mmol/L (ref 136–145)

## 2012-10-10 LAB — CULTURE, BLOOD (SINGLE)

## 2012-10-13 LAB — BASIC METABOLIC PANEL
Anion Gap: 9 (ref 7–16)
BUN: 19 mg/dL — ABNORMAL HIGH (ref 7–18)
Chloride: 105 mmol/L (ref 98–107)
Creatinine: 1.09 mg/dL (ref 0.60–1.30)
EGFR (African American): >60
Glucose: 86 mg/dL (ref 65–99)

## 2012-10-13 LAB — URINALYSIS, COMPLETE
Bacteria: NONE SEEN
Bilirubin,UR: NEGATIVE
Blood: NEGATIVE
Ketone: NEGATIVE
Leukocyte Esterase: NEGATIVE
Nitrite: NEGATIVE
RBC,UR: <1 /HPF (ref 0–5)
WBC UR: 1 /HPF (ref 0–5)

## 2012-10-13 LAB — CBC WITH DIFFERENTIAL/PLATELET
Bands: 1 %
Eosinophil: 14 %
HGB: 10.6 g/dL — ABNORMAL LOW (ref 13.0–18.0)
MCH: 32.6 pg (ref 26.0–34.0)
MCHC: 34 g/dL (ref 32.0–36.0)
Monocytes: 9 %
RBC: 3.25 10*6/uL — ABNORMAL LOW (ref 4.40–5.90)
RDW: 15.6 % — ABNORMAL HIGH (ref 11.5–14.5)
Segmented Neutrophils: 51 %

## 2012-10-27 LAB — CBC WITH DIFFERENTIAL/PLATELET
Bands: 4 %
HGB: 11.1 g/dL — ABNORMAL LOW (ref 13.0–18.0)
MCH: 32 pg (ref 26.0–34.0)
MCHC: 33.8 g/dL (ref 32.0–36.0)
MCV: 95 fL (ref 80–100)
Metamyelocyte: 1 %
Platelet: 297 10*3/uL (ref 150–440)
RDW: 15.3 % — ABNORMAL HIGH (ref 11.5–14.5)
Segmented Neutrophils: 54 %

## 2012-10-27 LAB — BASIC METABOLIC PANEL
Anion Gap: 8 (ref 7–16)
BUN: 22 mg/dL — ABNORMAL HIGH (ref 7–18)
Creatinine: 1.16 mg/dL (ref 0.60–1.30)
EGFR (African American): >60
Glucose: 91 mg/dL (ref 65–99)
Sodium: 138 mmol/L (ref 136–145)

## 2012-11-04 LAB — URINALYSIS, COMPLETE
Bilirubin,UR: NEGATIVE
Glucose,UR: NEGATIVE mg/dL (ref 0–75)
Leukocyte Esterase: NEGATIVE
Nitrite: NEGATIVE
Ph: 7 (ref 4.5–8.0)
Protein: NEGATIVE
RBC,UR: NONE SEEN /HPF (ref 0–5)
Squamous Epithelial: NONE SEEN
WBC UR: 1 /HPF (ref 0–5)

## 2012-11-04 LAB — CBC WITH DIFFERENTIAL/PLATELET
Eosinophil %: 6.1 %
HGB: 10.9 g/dL — ABNORMAL LOW (ref 13.0–18.0)
Lymphocyte #: 0.6 10*3/uL — ABNORMAL LOW (ref 1.0–3.6)
Lymphocyte %: 14.5 %
MCH: 31.3 pg (ref 26.0–34.0)
MCV: 95 fL (ref 80–100)
Neutrophil #: 2.6 10*3/uL (ref 1.4–6.5)
Neutrophil %: 60.3 %
RBC: 3.49 10*6/uL — ABNORMAL LOW (ref 4.40–5.90)

## 2012-11-04 LAB — COMPREHENSIVE METABOLIC PANEL
Alkaline Phosphatase: 119 U/L (ref 50–136)
Anion Gap: 9 (ref 7–16)
Bilirubin,Total: <0.1 mg/dL — ABNORMAL LOW (ref 0.2–1.0)
Calcium, Total: 9.1 mg/dL (ref 8.5–10.1)
Co2: 22 mmol/L (ref 21–32)
EGFR (African American): >60
Glucose: 84 mg/dL (ref 65–99)
Osmolality: 280 (ref 275–301)
Potassium: 4.2 mmol/L (ref 3.5–5.1)
SGOT(AST): 23 U/L (ref 15–37)
Sodium: 139 mmol/L (ref 136–145)

## 2012-11-09 ENCOUNTER — Encounter: Payer: Self-pay | Admitting: Internal Medicine

## 2012-11-10 LAB — COMPREHENSIVE METABOLIC PANEL
Albumin: 3.1 g/dL — ABNORMAL LOW (ref 3.4–5.0)
Alkaline Phosphatase: 114 U/L (ref 50–136)
Anion Gap: 12 (ref 7–16)
Bilirubin,Total: 0.3 mg/dL (ref 0.2–1.0)
Calcium, Total: 9.8 mg/dL (ref 8.5–10.1)
Chloride: 106 mmol/L (ref 98–107)
Co2: 21 mmol/L (ref 21–32)
EGFR (Non-African Amer.): 55 — ABNORMAL LOW
Glucose: 97 mg/dL (ref 65–99)
Osmolality: 282 (ref 275–301)
SGOT(AST): 22 U/L (ref 15–37)
SGPT (ALT): 22 U/L (ref 12–78)
Sodium: 139 mmol/L (ref 136–145)
Total Protein: 7.2 g/dL (ref 6.4–8.2)

## 2012-11-10 LAB — CBC WITH DIFFERENTIAL/PLATELET
Basophil #: 0 10*3/uL (ref 0.0–0.1)
Basophil %: 0.4 %
Eosinophil %: 4.1 %
HCT: 35.9 % — ABNORMAL LOW (ref 40.0–52.0)
Lymphocyte #: 0.8 10*3/uL — ABNORMAL LOW (ref 1.0–3.6)
Lymphocyte %: 17.8 %
MCH: 32.1 pg (ref 26.0–34.0)
MCHC: 33.8 g/dL (ref 32.0–36.0)
MCV: 95 fL (ref 80–100)
Monocyte %: 16.1 %
Neutrophil %: 61.6 %
Platelet: 299 10*3/uL (ref 150–440)
RDW: 15.4 % — ABNORMAL HIGH (ref 11.5–14.5)
WBC: 4.6 10*3/uL (ref 3.8–10.6)

## 2012-11-10 LAB — LACTATE DEHYDROGENASE: LDH: 150 U/L (ref 85–241)

## 2012-11-12 ENCOUNTER — Ambulatory Visit: Payer: Self-pay | Admitting: Internal Medicine

## 2012-11-15 LAB — CLOSTRIDIUM DIFFICILE BY PCR

## 2012-11-16 LAB — URINALYSIS, COMPLETE
Bacteria: NONE SEEN
Bilirubin,UR: NEGATIVE
Bilirubin,UR: NEGATIVE
Blood: NEGATIVE
Glucose,UR: NEGATIVE mg/dL (ref 0–75)
Ketone: NEGATIVE
Ketone: NEGATIVE
Leukocyte Esterase: NEGATIVE
Nitrite: NEGATIVE
Protein: NEGATIVE
RBC,UR: <1 /HPF (ref 0–5)
Specific Gravity: 1.009 (ref 1.003–1.030)
Specific Gravity: 1.008 (ref 1.003–1.030)
Squamous Epithelial: <1
WBC UR: <1 /HPF (ref 0–5)
WBC UR: <1 /HPF (ref 0–5)

## 2012-11-16 LAB — CBC WITH DIFFERENTIAL/PLATELET
Basophil #: 0 10*3/uL (ref 0.0–0.1)
Basophil %: 0.6 %
Eosinophil #: 0.2 10*3/uL (ref 0.0–0.7)
Eosinophil %: 4.4 %
HCT: 39.6 % — ABNORMAL LOW (ref 40.0–52.0)
Lymphocyte #: 0.7 10*3/uL — ABNORMAL LOW (ref 1.0–3.6)
Lymphocyte %: 13.6 %
Monocyte #: 0.6 x10 3/mm (ref 0.2–1.0)
Neutrophil #: 3.5 10*3/uL (ref 1.4–6.5)
Neutrophil %: 69.7 %
WBC: 5.1 10*3/uL (ref 3.8–10.6)

## 2012-11-24 LAB — COMPREHENSIVE METABOLIC PANEL
Albumin: 2.9 g/dL — ABNORMAL LOW (ref 3.4–5.0)
Alkaline Phosphatase: 102 U/L (ref 50–136)
Anion Gap: 10 (ref 7–16)
BUN: 23 mg/dL — ABNORMAL HIGH (ref 7–18)
Bilirubin,Total: 0.2 mg/dL (ref 0.2–1.0)
Calcium, Total: 9.3 mg/dL (ref 8.5–10.1)
Co2: 22 mmol/L (ref 21–32)
Creatinine: 1.28 mg/dL (ref 0.60–1.30)
EGFR (African American): >60
EGFR (Non-African Amer.): 54 — ABNORMAL LOW
Osmolality: 279 (ref 275–301)
Potassium: 3.9 mmol/L (ref 3.5–5.1)
SGPT (ALT): 23 U/L (ref 12–78)
Total Protein: 6.6 g/dL (ref 6.4–8.2)

## 2012-11-24 LAB — CBC WITH DIFFERENTIAL/PLATELET
Eosinophil: 9 %
HCT: 35.6 % — ABNORMAL LOW (ref 40.0–52.0)
HGB: 11.8 g/dL — ABNORMAL LOW (ref 13.0–18.0)
MCHC: 33.2 g/dL (ref 32.0–36.0)
Metamyelocyte: 3 %
Monocytes: 12 %
Platelet: 272 10*3/uL (ref 150–440)
RDW: 16 % — ABNORMAL HIGH (ref 11.5–14.5)
Segmented Neutrophils: 52 %
Variant Lymphocyte - H1-Rlymph: 3 %
WBC: 3.6 10*3/uL — ABNORMAL LOW (ref 3.8–10.6)

## 2012-12-08 LAB — COMPREHENSIVE METABOLIC PANEL
Albumin: 2.9 g/dL — ABNORMAL LOW (ref 3.4–5.0)
Anion Gap: 8 (ref 7–16)
BUN: 20 mg/dL — ABNORMAL HIGH (ref 7–18)
Bilirubin,Total: 0.2 mg/dL (ref 0.2–1.0)
Calcium, Total: 8.9 mg/dL (ref 8.5–10.1)
Chloride: 112 mmol/L — ABNORMAL HIGH (ref 98–107)
Co2: 21 mmol/L (ref 21–32)
Creatinine: 0.95 mg/dL (ref 0.60–1.30)
EGFR (Non-African Amer.): >60
Glucose: 122 mg/dL — ABNORMAL HIGH (ref 65–99)
Osmolality: 285 (ref 275–301)
Potassium: 3.8 mmol/L (ref 3.5–5.1)
SGOT(AST): 39 U/L — ABNORMAL HIGH (ref 15–37)
SGPT (ALT): 43 U/L (ref 12–78)
Sodium: 141 mmol/L (ref 136–145)

## 2012-12-08 LAB — CBC WITH DIFFERENTIAL/PLATELET
Bands: 5 %
HGB: 11 g/dL — ABNORMAL LOW (ref 13.0–18.0)
Lymphocytes: 23 %
MCHC: 32.1 g/dL (ref 32.0–36.0)
MCV: 95 fL (ref 80–100)
Myelocyte: 1 %
RDW: 16.9 % — ABNORMAL HIGH (ref 11.5–14.5)
Variant Lymphocyte - H1-Rlymph: 6 %

## 2012-12-25 ENCOUNTER — Other Ambulatory Visit: Payer: Self-pay

## 2012-12-26 LAB — URINALYSIS, COMPLETE
Bacteria: NONE SEEN
Ketone: NEGATIVE
Leukocyte Esterase: NEGATIVE
Ph: 5 (ref 4.5–8.0)
Protein: 30
RBC,UR: 3 /HPF (ref 0–5)
Specific Gravity: 1.021 (ref 1.003–1.030)

## 2012-12-27 ENCOUNTER — Encounter: Payer: Self-pay | Admitting: Internal Medicine

## 2012-12-27 LAB — CBC WITH DIFFERENTIAL/PLATELET
Basophil #: 0 10*3/uL (ref 0.0–0.1)
Eosinophil %: 3.9 %
HCT: 31.9 % — ABNORMAL LOW (ref 40.0–52.0)
HGB: 10.7 g/dL — ABNORMAL LOW (ref 13.0–18.0)
Lymphocyte #: 0.2 10*3/uL — ABNORMAL LOW (ref 1.0–3.6)
Lymphocyte %: 3.7 %
MCH: 31.5 pg (ref 26.0–34.0)
MCHC: 33.4 g/dL (ref 32.0–36.0)
MCV: 94 fL (ref 80–100)
Monocyte #: 0.4 x10 3/mm (ref 0.2–1.0)
Neutrophil #: 5.3 10*3/uL (ref 1.4–6.5)
Neutrophil %: 86.3 %
Platelet: 242 10*3/uL (ref 150–440)
RBC: 3.38 10*6/uL — ABNORMAL LOW (ref 4.40–5.90)
WBC: 6.1 10*3/uL (ref 3.8–10.6)

## 2012-12-27 LAB — URINE CULTURE

## 2013-01-03 LAB — CBC WITH DIFFERENTIAL/PLATELET
Eosinophil #: 0.5 10*3/uL (ref 0.0–0.7)
HGB: 11.2 g/dL — ABNORMAL LOW (ref 13.0–18.0)
Lymphocyte #: 0.4 10*3/uL — ABNORMAL LOW (ref 1.0–3.6)
Lymphocyte %: 6.9 %
MCHC: 33.2 g/dL (ref 32.0–36.0)
Monocyte #: 0.5 x10 3/mm (ref 0.2–1.0)
Monocyte %: 10.4 %
Neutrophil #: 3.7 10*3/uL (ref 1.4–6.5)
Platelet: 322 10*3/uL (ref 150–440)
RDW: 16.8 % — ABNORMAL HIGH (ref 11.5–14.5)
WBC: 5.1 10*3/uL (ref 3.8–10.6)

## 2013-01-03 LAB — COMPREHENSIVE METABOLIC PANEL
Albumin: 2.8 g/dL — ABNORMAL LOW (ref 3.4–5.0)
Alkaline Phosphatase: 99 U/L (ref 50–136)
Anion Gap: 11 (ref 7–16)
Bilirubin,Total: 0.3 mg/dL (ref 0.2–1.0)
Chloride: 106 mmol/L (ref 98–107)
Co2: 20 mmol/L — ABNORMAL LOW (ref 21–32)
EGFR (Non-African Amer.): >60
Glucose: 101 mg/dL — ABNORMAL HIGH (ref 65–99)
Potassium: 8.2 mmol/L (ref 3.5–5.1)
SGOT(AST): 25 U/L (ref 15–37)
Sodium: 137 mmol/L (ref 136–145)
Total Protein: 7 g/dL (ref 6.4–8.2)

## 2013-01-03 LAB — POTASSIUM: Potassium: 4.1 mmol/L (ref 3.5–5.1)

## 2013-01-04 ENCOUNTER — Ambulatory Visit: Payer: Self-pay

## 2013-01-12 ENCOUNTER — Encounter: Payer: Self-pay | Admitting: Internal Medicine

## 2013-01-12 ENCOUNTER — Ambulatory Visit: Payer: Self-pay | Admitting: Internal Medicine

## 2013-01-12 LAB — CBC WITH DIFFERENTIAL/PLATELET
Basophil #: 0 10*3/uL (ref 0.0–0.1)
Eosinophil #: 0.4 10*3/uL (ref 0.0–0.7)
HCT: 36.5 % — ABNORMAL LOW (ref 40.0–52.0)
Lymphocyte #: 0.4 10*3/uL — ABNORMAL LOW (ref 1.0–3.6)
MCH: 31.5 pg (ref 26.0–34.0)
MCHC: 33.5 g/dL (ref 32.0–36.0)
Neutrophil %: 76.6 %
Platelet: 293 10*3/uL (ref 150–440)
RDW: 16.6 % — ABNORMAL HIGH (ref 11.5–14.5)
WBC: 5.4 10*3/uL (ref 3.8–10.6)

## 2013-01-12 IMAGING — US US EXTREM LOW VENOUS BILAT
1 series · 14 of 24 positions shown · non-contrast
Comparison: none

REASON FOR EXAM: bilateral PE
COMMENTS:

[Series 1: us extrem low venous bilat · 0.11mm/px · 14 of 56 slices shown]
[im 1/56]
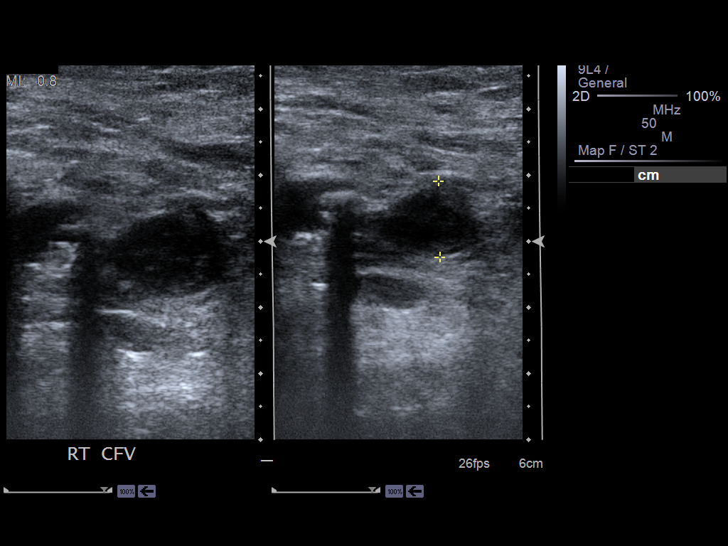
[im 5/56]
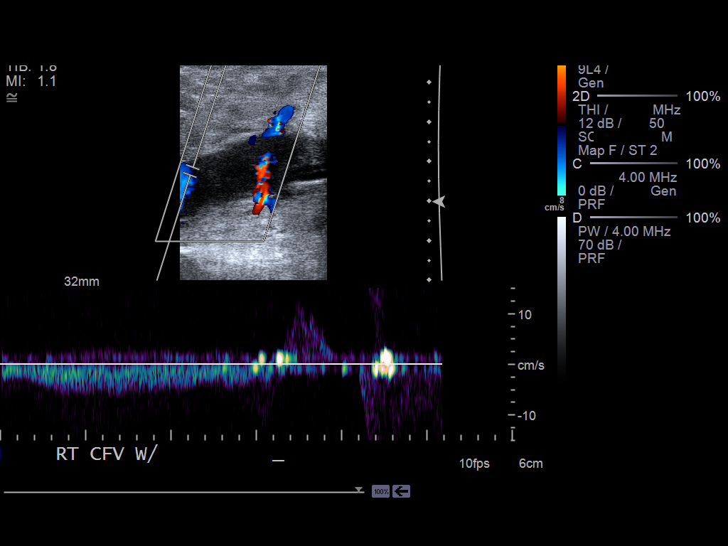
[im 10/56]
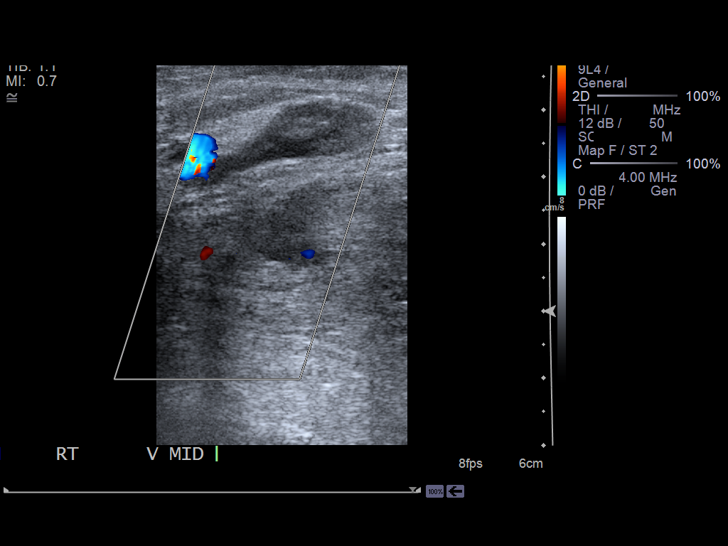
[im 15/56]
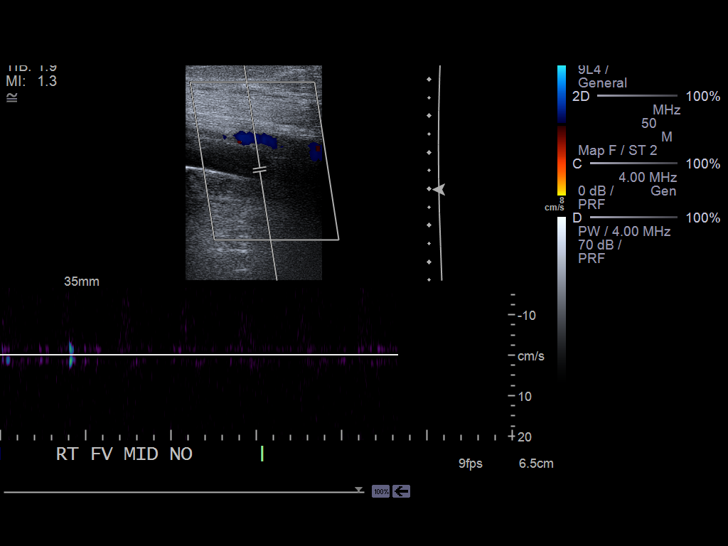
[im 17/56]
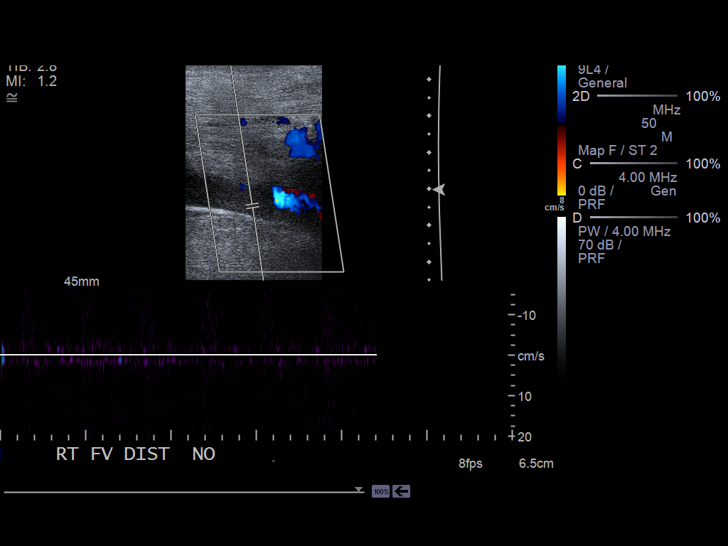
[im 22/56]
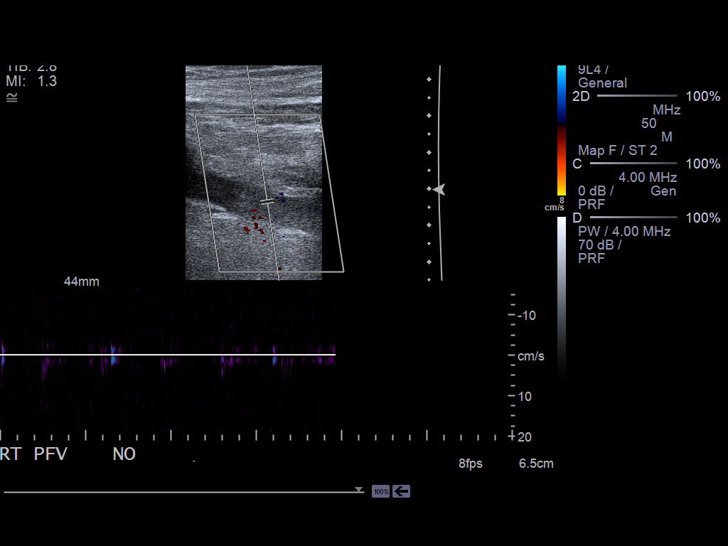
[im 27/56]
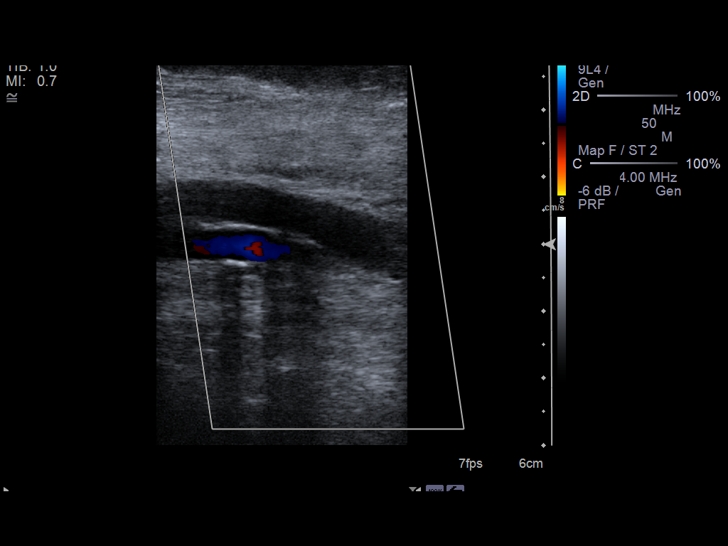
[im 29/56]
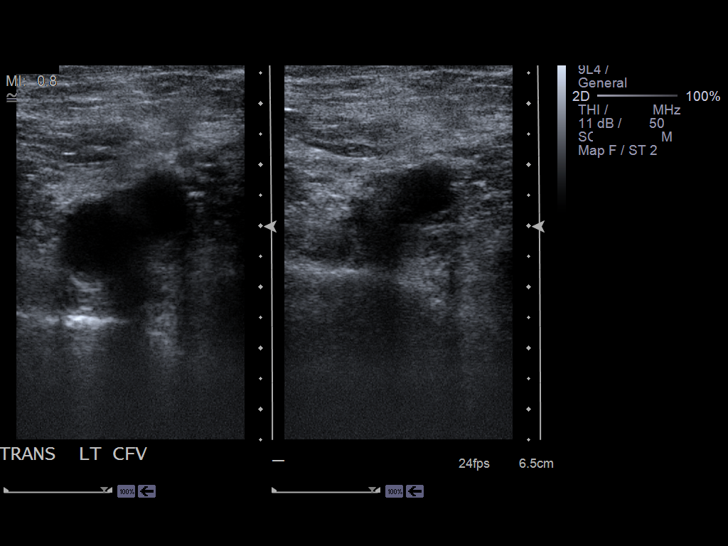
[im 34/56]
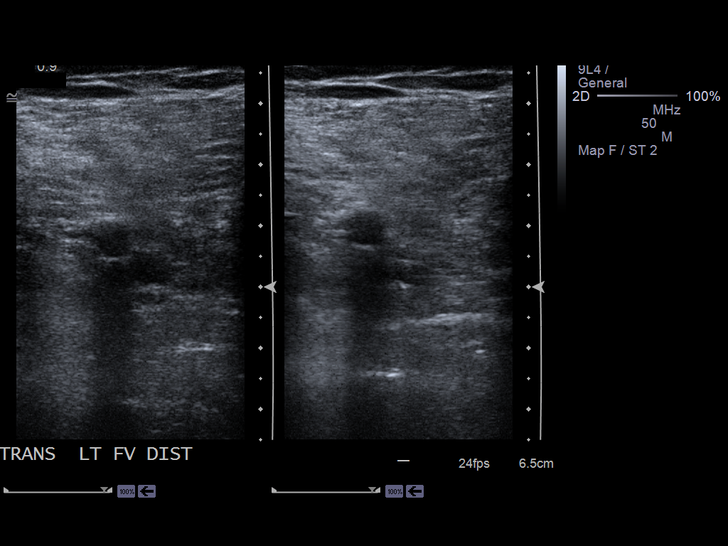
[im 39/56]
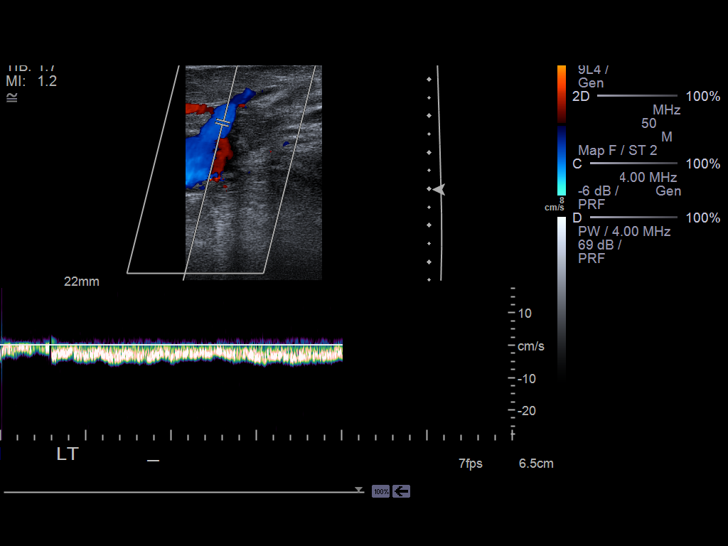
[im 44/56]
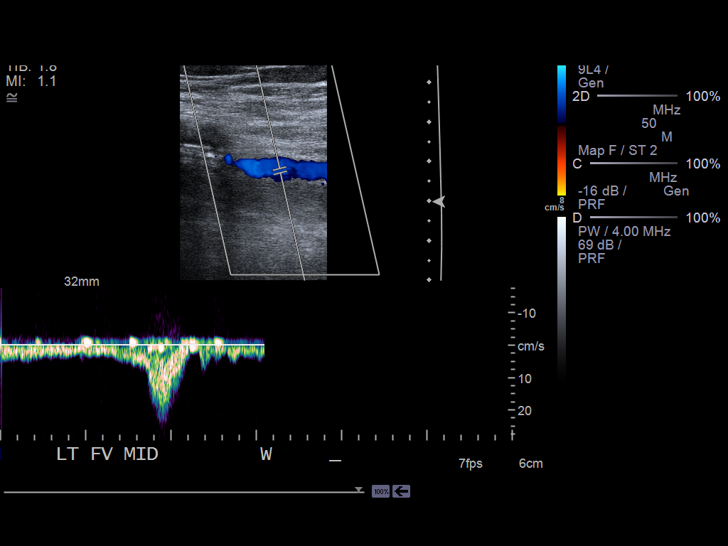
[im 46/56]
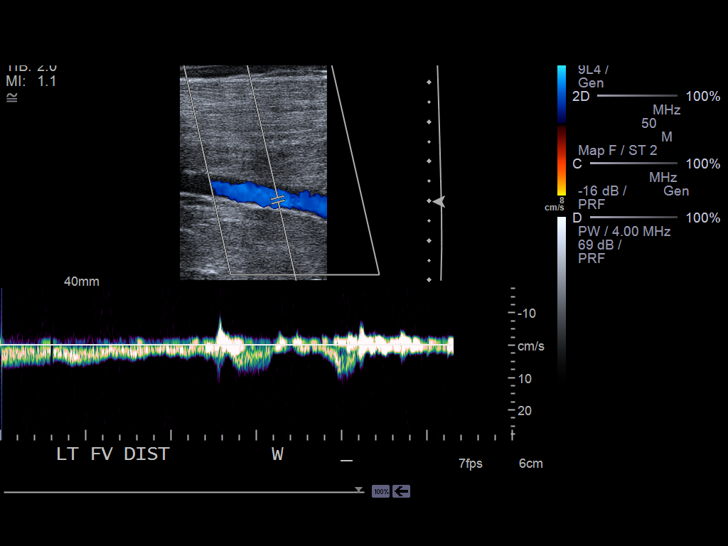
[im 51/56]
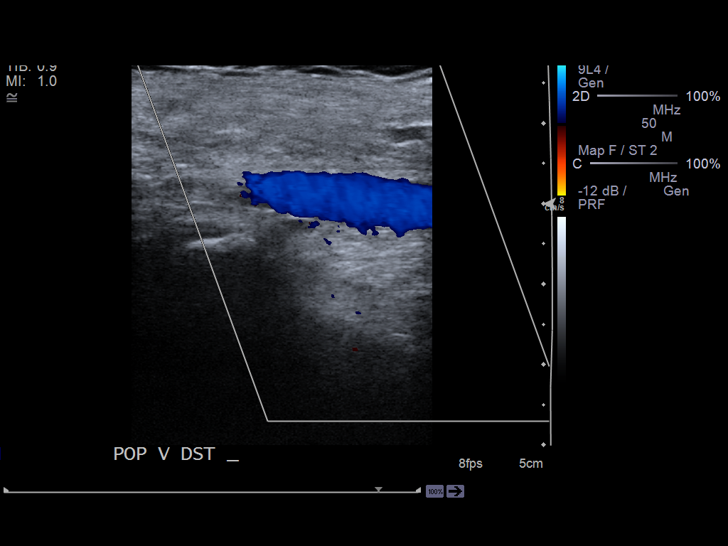
[im 56/56]
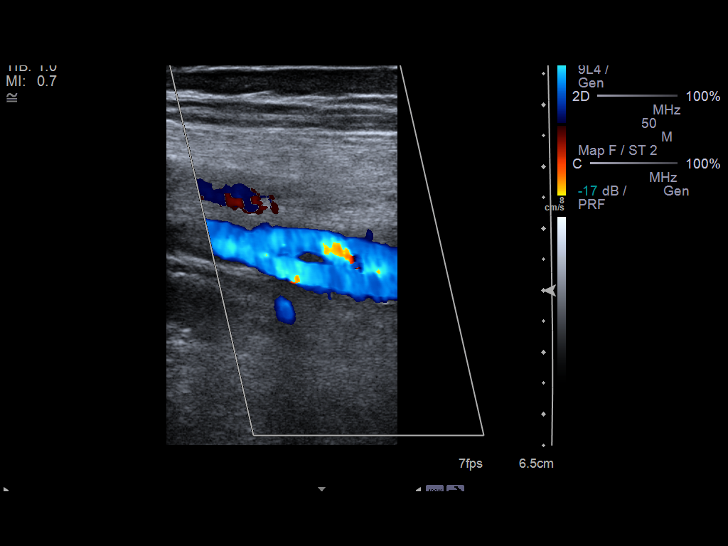

[14 of 24 positions shown; findings below may reference images not displayed]

PROCEDURE:     US  - US DOPPLER LOW EXTR BILATERAL  - August 10, 2012  [DATE]

RESULT:     The right and left femoral and popliteal veins were interrogated
with duplex Doppler ultrasound

On the right there is occlusive thrombus in the distal common femoral artery
as well as in the superficial femoral artery and popliteal artery. A small
amount is noted in the greater saphenous vein. On the left there is
nonocclusive thrombus in the distal common femoral vein and in the proximal
superficial femoral vein.
IMPRESSION: The findings are consistent with deep venous thrombosis
bilaterally in the lower extremities.

[REDACTED]

## 2013-01-13 IMAGING — XA IR VASCULAR PROCEDURE
2 series · 3 of 3 positions shown · non-contrast
Comparison: none

[Series 1: ivc · 2 of 2 slices shown]
[im 1/2]
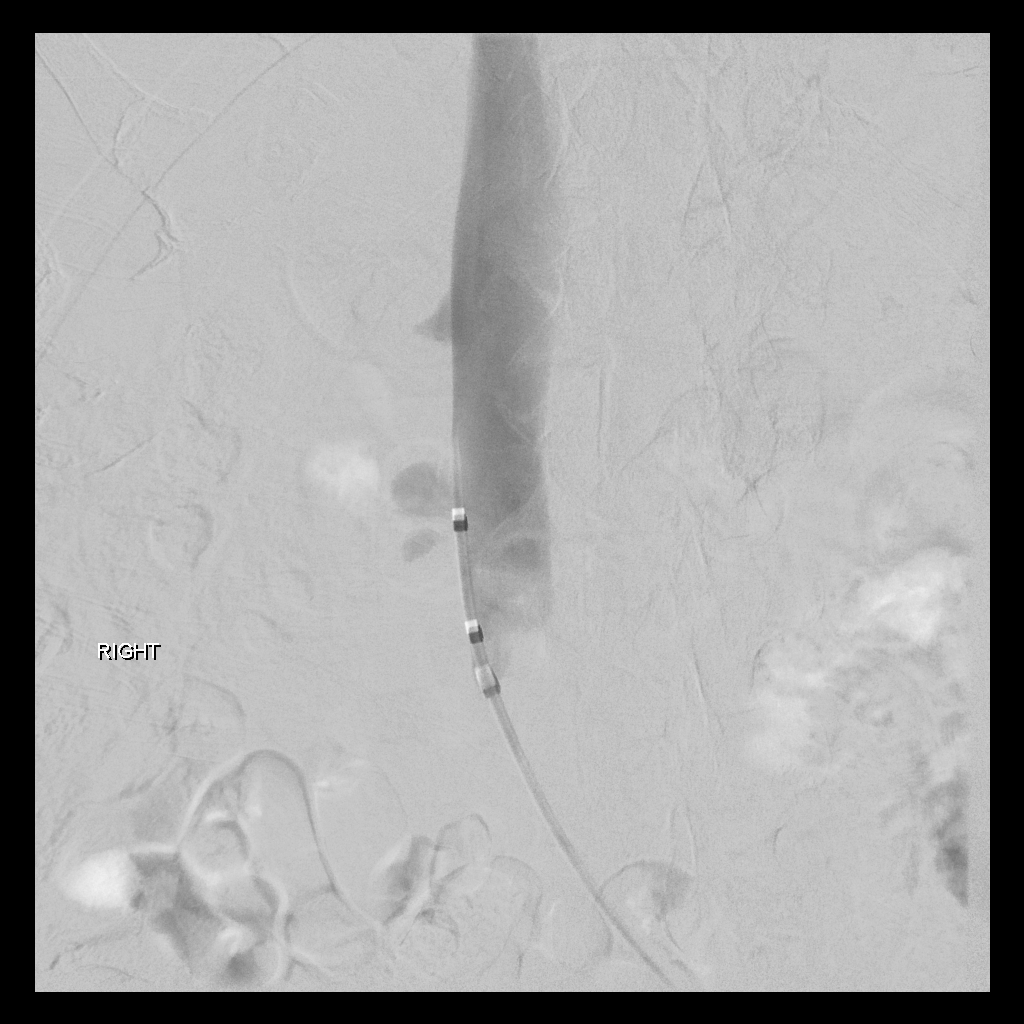
[im 2/2]
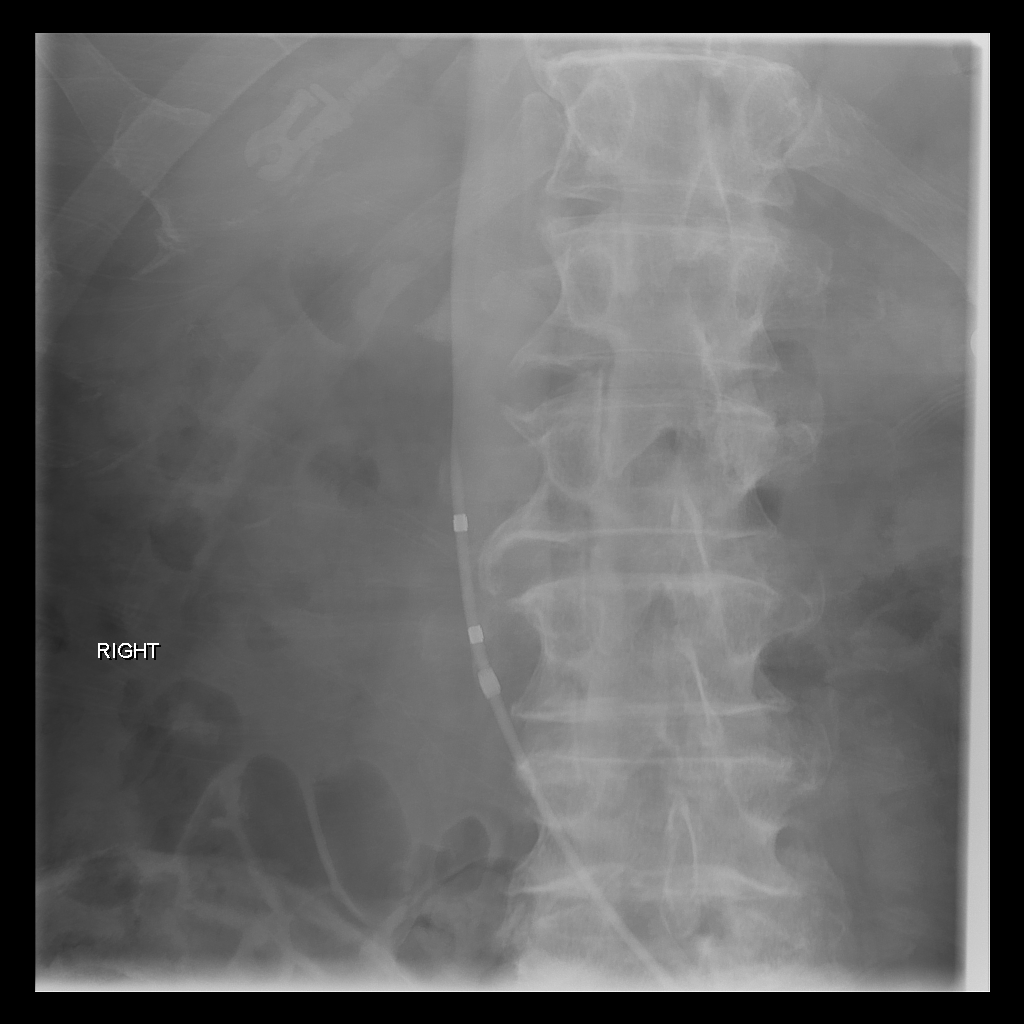

[Series 2: single · 1 of 1 slices shown]
[im 1/1]
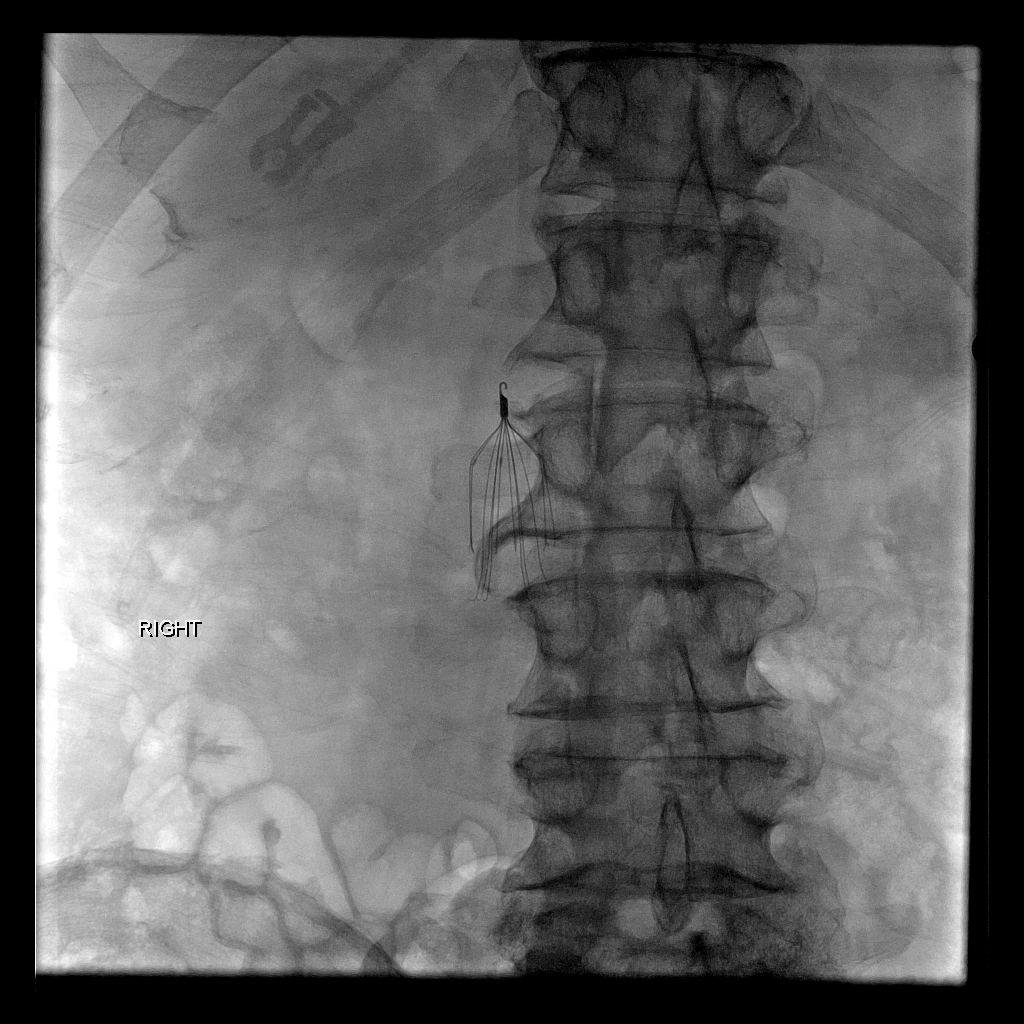

[3 of 3 positions shown; findings below may reference images not displayed]

IMAGES IMPORTED FROM THE [employer] WORKFLOW SYSTEM
NO DICTATION FOR STUDY

## 2013-01-16 ENCOUNTER — Ambulatory Visit: Payer: Self-pay | Admitting: Internal Medicine

## 2013-01-19 LAB — CULTURE, BLOOD (SINGLE)

## 2013-01-24 ENCOUNTER — Encounter: Payer: Self-pay | Admitting: Internal Medicine

## 2013-01-25 LAB — BASIC METABOLIC PANEL
Anion Gap: 7 (ref 7–16)
BUN: 17 mg/dL (ref 7–18)
Calcium, Total: 8.8 mg/dL (ref 8.5–10.1)
Creatinine: 1.11 mg/dL (ref 0.60–1.30)
Osmolality: 272 (ref 275–301)
Potassium: 4 mmol/L (ref 3.5–5.1)

## 2013-01-25 LAB — VANCOMYCIN, TROUGH: Vancomycin, Trough: 15 ug/mL (ref 10–20)

## 2013-01-30 LAB — CBC WITH DIFFERENTIAL/PLATELET
Basophil #: 0 10*3/uL (ref 0.0–0.1)
Basophil %: 0.7 %
Eosinophil #: 0.2 10*3/uL (ref 0.0–0.7)
Eosinophil %: 14.1 %
HCT: 53.6 % — ABNORMAL HIGH (ref 40.0–52.0)
Lymphocyte #: 0.2 10*3/uL — ABNORMAL LOW (ref 1.0–3.6)
Lymphocyte %: 14.1 %
MCH: 31.3 pg (ref 26.0–34.0)
MCV: 94 fL (ref 80–100)
Monocyte #: 0.3 x10 3/mm (ref 0.2–1.0)
Monocyte %: 18.9 %
Neutrophil %: 52.2 %
Platelet: 129 10*3/uL — ABNORMAL LOW (ref 150–440)
RDW: 17 % — ABNORMAL HIGH (ref 11.5–14.5)
WBC: 1.7 10*3/uL — CL (ref 3.8–10.6)

## 2013-01-30 LAB — BASIC METABOLIC PANEL
Anion Gap: 6 — ABNORMAL LOW (ref 7–16)
Calcium, Total: 9.1 mg/dL (ref 8.5–10.1)
Chloride: 104 mmol/L (ref 98–107)
Creatinine: 1.19 mg/dL (ref 0.60–1.30)
EGFR (African American): >60
Glucose: 76 mg/dL (ref 65–99)
Osmolality: 272 (ref 275–301)

## 2013-02-02 LAB — CBC WITH DIFFERENTIAL/PLATELET
Bands: 5 %
Basophil: 2 %
Eosinophil: 12 %
HCT: 35.9 % — ABNORMAL LOW (ref 40.0–52.0)
Lymphocytes: 13 %
Metamyelocyte: 1 %
Monocytes: 20 %
Myelocyte: 1 %
Platelet: 232 10*3/uL (ref 150–440)
RDW: 16.8 % — ABNORMAL HIGH (ref 11.5–14.5)
Variant Lymphocyte - H1-Rlymph: 3 %

## 2013-02-02 LAB — BASIC METABOLIC PANEL
Anion Gap: 7 (ref 7–16)
BUN: 15 mg/dL (ref 7–18)
Co2: 24 mmol/L (ref 21–32)
Creatinine: 1.01 mg/dL (ref 0.60–1.30)
EGFR (African American): >60
EGFR (Non-African Amer.): >60
Glucose: 85 mg/dL (ref 65–99)

## 2013-02-06 LAB — BASIC METABOLIC PANEL
Chloride: 102 mmol/L (ref 98–107)
Co2: 24 mmol/L (ref 21–32)
Creatinine: 1.23 mg/dL (ref 0.60–1.30)
Glucose: 74 mg/dL (ref 65–99)
Osmolality: 274 (ref 275–301)
Potassium: 4.1 mmol/L (ref 3.5–5.1)
Sodium: 137 mmol/L (ref 136–145)

## 2013-02-06 LAB — CBC WITH DIFFERENTIAL/PLATELET
Basophil #: 0 10*3/uL (ref 0.0–0.1)
HCT: 33.6 % — ABNORMAL LOW (ref 40.0–52.0)
HGB: 11.2 g/dL — ABNORMAL LOW (ref 13.0–18.0)
Lymphocyte #: 0.4 10*3/uL — ABNORMAL LOW (ref 1.0–3.6)
Lymphocyte %: 9.8 %
MCH: 31.2 pg (ref 26.0–34.0)
MCHC: 33.2 g/dL (ref 32.0–36.0)
Monocyte #: 0.8 x10 3/mm (ref 0.2–1.0)
Monocyte %: 19.1 %
Neutrophil #: 2.5 10*3/uL (ref 1.4–6.5)
RBC: 3.58 10*6/uL — ABNORMAL LOW (ref 4.40–5.90)
WBC: 4.3 10*3/uL (ref 3.8–10.6)

## 2013-02-07 ENCOUNTER — Encounter: Payer: Self-pay | Admitting: Internal Medicine

## 2013-02-09 LAB — VANCOMYCIN, TROUGH: Vancomycin, Trough: 18 ug/mL (ref 10–20)

## 2013-02-17 LAB — VANCOMYCIN, TROUGH: Vancomycin, Trough: 21 ug/mL (ref 10–20)

## 2013-02-20 LAB — VANCOMYCIN, TROUGH: Vancomycin, Trough: 17 ug/mL (ref 10–20)

## 2013-02-22 LAB — CBC WITH DIFFERENTIAL/PLATELET
Basophil %: 0.7 %
Eosinophil #: 0.5 10*3/uL (ref 0.0–0.7)
Eosinophil %: 11.9 %
HCT: 34.5 % — ABNORMAL LOW (ref 40.0–52.0)
HGB: 11.4 g/dL — ABNORMAL LOW (ref 13.0–18.0)
Lymphocyte #: 0.5 10*3/uL — ABNORMAL LOW (ref 1.0–3.6)
Lymphocyte %: 11.5 %
MCV: 96 fL (ref 80–100)
Monocyte #: 0.9 x10 3/mm (ref 0.2–1.0)
Neutrophil #: 2.3 10*3/uL (ref 1.4–6.5)
Platelet: 214 10*3/uL (ref 150–440)
RBC: 3.61 10*6/uL — ABNORMAL LOW (ref 4.40–5.90)
RDW: 17.2 % — ABNORMAL HIGH (ref 11.5–14.5)
WBC: 4.2 10*3/uL (ref 3.8–10.6)

## 2013-02-22 LAB — BASIC METABOLIC PANEL
Anion Gap: 6 — ABNORMAL LOW (ref 7–16)
BUN: 20 mg/dL — ABNORMAL HIGH (ref 7–18)
Glucose: 79 mg/dL (ref 65–99)
Potassium: 3.9 mmol/L (ref 3.5–5.1)
Sodium: 140 mmol/L (ref 136–145)

## 2013-02-27 LAB — VANCOMYCIN, TROUGH: Vancomycin, Trough: 11 ug/mL (ref 10–20)

## 2013-03-01 LAB — CBC WITH DIFFERENTIAL/PLATELET
Eosinophil: 20 %
HGB: 12.2 g/dL — ABNORMAL LOW (ref 13.0–18.0)
MCHC: 33.9 g/dL (ref 32.0–36.0)
MCV: 97 fL (ref 80–100)
Monocytes: 31 %
Other Cells Blood: 2
RDW: 17.2 % — ABNORMAL HIGH (ref 11.5–14.5)
Segmented Neutrophils: 25 %
WBC: 2.9 10*3/uL — ABNORMAL LOW (ref 3.8–10.6)

## 2013-03-01 LAB — BASIC METABOLIC PANEL
Anion Gap: 10 (ref 7–16)
Calcium, Total: 9.3 mg/dL (ref 8.5–10.1)
Chloride: 105 mmol/L (ref 98–107)
Creatinine: 1.27 mg/dL (ref 0.60–1.30)
Osmolality: 279 (ref 275–301)
Potassium: 3.5 mmol/L (ref 3.5–5.1)
Sodium: 140 mmol/L (ref 136–145)

## 2013-03-07 LAB — BASIC METABOLIC PANEL
Anion Gap: 9 (ref 7–16)
Calcium, Total: 9.5 mg/dL (ref 8.5–10.1)
Co2: 22 mmol/L (ref 21–32)
Creatinine: 1.26 mg/dL (ref 0.60–1.30)
Glucose: 99 mg/dL (ref 65–99)
Osmolality: 279 (ref 275–301)
Potassium: 4 mmol/L (ref 3.5–5.1)
Sodium: 138 mmol/L (ref 136–145)

## 2013-03-07 LAB — CBC WITH DIFFERENTIAL/PLATELET
Basophil: 1 %
HCT: 40.5 % (ref 40.0–52.0)
HGB: 13.8 g/dL (ref 13.0–18.0)
MCH: 32.6 pg (ref 26.0–34.0)
MCHC: 34.1 g/dL (ref 32.0–36.0)
MCV: 96 fL (ref 80–100)
Monocytes: 39 %
RBC: 4.24 10*6/uL — ABNORMAL LOW (ref 4.40–5.90)
RDW: 17.3 % — ABNORMAL HIGH (ref 11.5–14.5)
Segmented Neutrophils: 10 %
Variant Lymphocyte - H1-Rlymph: 1 %
WBC: 1.4 10*3/uL — CL (ref 3.8–10.6)

## 2013-03-09 ENCOUNTER — Encounter: Payer: Self-pay | Admitting: Internal Medicine

## 2013-03-16 LAB — CBC WITH DIFFERENTIAL/PLATELET
Basophil #: 0 10*3/uL (ref 0.0–0.1)
Eosinophil %: 7.6 %
HGB: 12.1 g/dL — ABNORMAL LOW (ref 13.0–18.0)
Lymphocyte #: 0.9 10*3/uL — ABNORMAL LOW (ref 1.0–3.6)
Lymphocyte %: 20.3 %
MCH: 32.2 pg (ref 26.0–34.0)
MCV: 94 fL (ref 80–100)
Monocyte %: 15 %
RDW: 16.5 % — ABNORMAL HIGH (ref 11.5–14.5)
WBC: 4.6 10*3/uL (ref 3.8–10.6)

## 2013-03-16 LAB — BASIC METABOLIC PANEL
Anion Gap: 8 (ref 7–16)
BUN: 23 mg/dL — ABNORMAL HIGH (ref 7–18)
Calcium, Total: 9.3 mg/dL (ref 8.5–10.1)
Co2: 25 mmol/L (ref 21–32)
Creatinine: 1.3 mg/dL (ref 0.60–1.30)
EGFR (Non-African Amer.): 53 — ABNORMAL LOW
Glucose: 83 mg/dL (ref 65–99)
Osmolality: 286 (ref 275–301)
Potassium: 4 mmol/L (ref 3.5–5.1)
Sodium: 142 mmol/L (ref 136–145)

## 2013-03-28 LAB — BASIC METABOLIC PANEL
BUN: 22 mg/dL — ABNORMAL HIGH (ref 7–18)
Chloride: 111 mmol/L — ABNORMAL HIGH (ref 98–107)
Co2: 21 mmol/L (ref 21–32)
Creatinine: 1.11 mg/dL (ref 0.60–1.30)
EGFR (African American): >60
EGFR (Non-African Amer.): >60
Osmolality: 285 (ref 275–301)
Sodium: 141 mmol/L (ref 136–145)

## 2013-03-28 LAB — CBC WITH DIFFERENTIAL/PLATELET
Basophil #: 0 10*3/uL (ref 0.0–0.1)
Basophil %: 0.6 %
Eosinophil #: 0.5 10*3/uL (ref 0.0–0.7)
Eosinophil %: 10.4 %
HCT: 39.6 % — ABNORMAL LOW (ref 40.0–52.0)
Lymphocyte %: 16.3 %
MCHC: 34.3 g/dL (ref 32.0–36.0)
Monocyte #: 0.6 x10 3/mm (ref 0.2–1.0)
Monocyte %: 13.8 %
Neutrophil #: 2.7 10*3/uL (ref 1.4–6.5)
Platelet: 240 10*3/uL (ref 150–440)
RDW: 16.9 % — ABNORMAL HIGH (ref 11.5–14.5)

## 2013-04-06 LAB — LIPID PANEL
Cholesterol: 159 mg/dL (ref 0–200)
HDL Cholesterol: 29 mg/dL — ABNORMAL LOW (ref 40–60)
VLDL Cholesterol, Calc: 33 mg/dL (ref 5–40)

## 2013-04-09 ENCOUNTER — Encounter: Payer: Self-pay | Admitting: Internal Medicine

## 2013-04-11 LAB — BASIC METABOLIC PANEL
Anion Gap: 5 — ABNORMAL LOW (ref 7–16)
BUN: 20 mg/dL — ABNORMAL HIGH (ref 7–18)
Calcium, Total: 9.2 mg/dL (ref 8.5–10.1)
Chloride: 108 mmol/L — ABNORMAL HIGH (ref 98–107)
Co2: 26 mmol/L (ref 21–32)
Creatinine: 1.29 mg/dL (ref 0.60–1.30)
EGFR (African American): >60
Glucose: 85 mg/dL (ref 65–99)
Osmolality: 279 (ref 275–301)
Sodium: 139 mmol/L (ref 136–145)

## 2013-04-11 LAB — CBC WITH DIFFERENTIAL/PLATELET
Eosinophil #: 0.3 10*3/uL (ref 0.0–0.7)
HCT: 36 % — ABNORMAL LOW (ref 40.0–52.0)
HGB: 12.4 g/dL — ABNORMAL LOW (ref 13.0–18.0)
Lymphocyte %: 14.5 %
MCV: 96 fL (ref 80–100)
Monocyte #: 0.6 x10 3/mm (ref 0.2–1.0)
Monocyte %: 15.3 %
Neutrophil #: 2.5 10*3/uL (ref 1.4–6.5)
Neutrophil %: 62.5 %
Platelet: 188 10*3/uL (ref 150–440)
RBC: 3.74 10*6/uL — ABNORMAL LOW (ref 4.40–5.90)
RDW: 17.1 % — ABNORMAL HIGH (ref 11.5–14.5)
WBC: 4 10*3/uL (ref 3.8–10.6)

## 2013-04-16 IMAGING — CT CT ABD-PELV W/ CM
1 of 2 series · 15 of 32 positions shown, 19 images · non-contrast
Comparison: none

REASON FOR EXAM: mid abd pain. CALL REPORT
COMMENTS:

PROCEDURE:     CT  - CT ABDOMEN / PELVIS  W  - November 12, 2012  [DATE]
RESULT:     CT abdomen and pelvis dated 11/12/2012 comparison made to prior
study dated 07/09/2012.
TECHNIQUE: Helical 3 mm sections were obtained from the lung bases through
the pubic symphysis status post intravenous administration of 100 mL of
Rsovue-D00 and oral contrast.

[Series 2: 3mm soft tissue · axial · 0.73mm/px · z∈[-909,-417]mm · 15 of 180 slices shown, 19 images]
[im 8/180  soft-tissue]
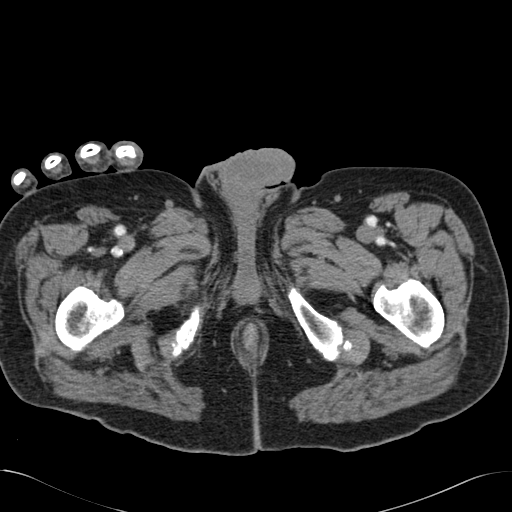
[im 8/180  bone]
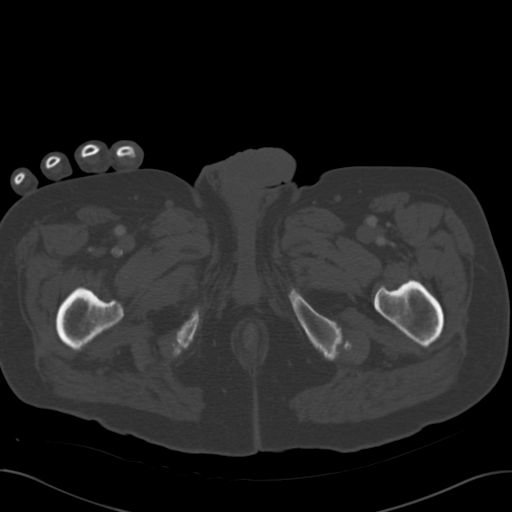
[im 24/180  soft-tissue]
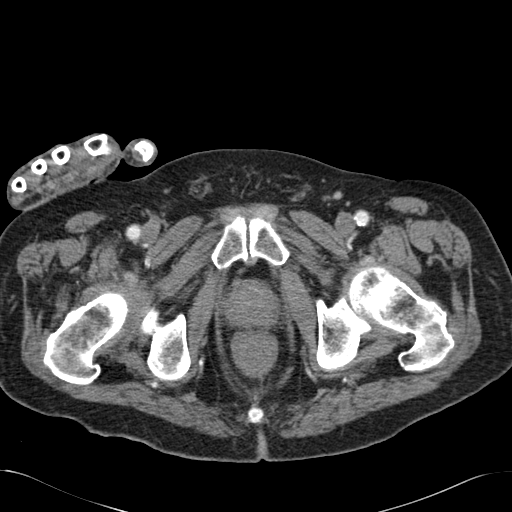
[im 39/180  soft-tissue]
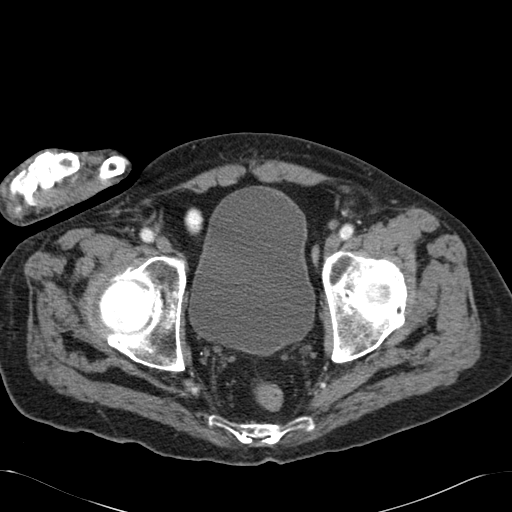
[im 47/180  soft-tissue]
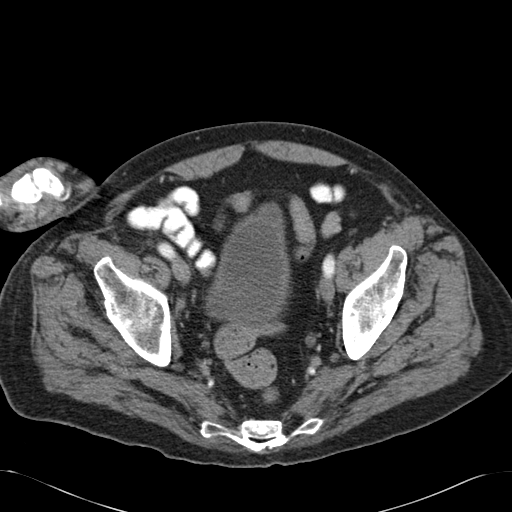
[im 63/180  soft-tissue]
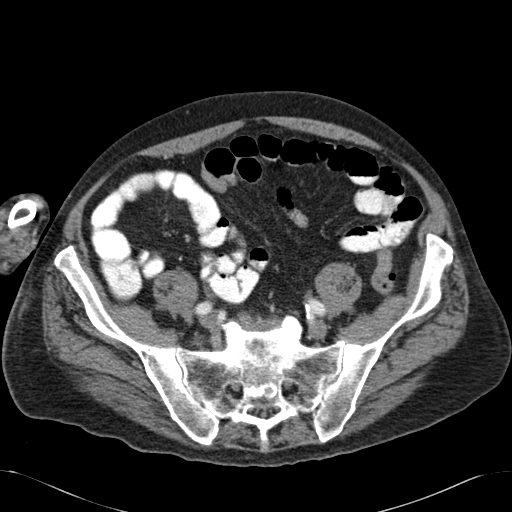
[im 78/180  soft-tissue]
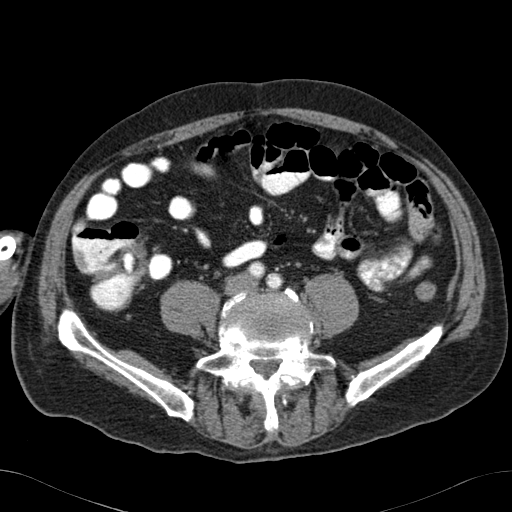
[im 94/180  soft-tissue]
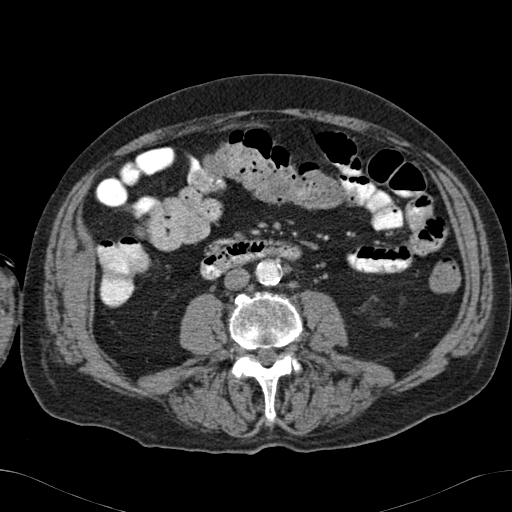
[im 102/180  soft-tissue]
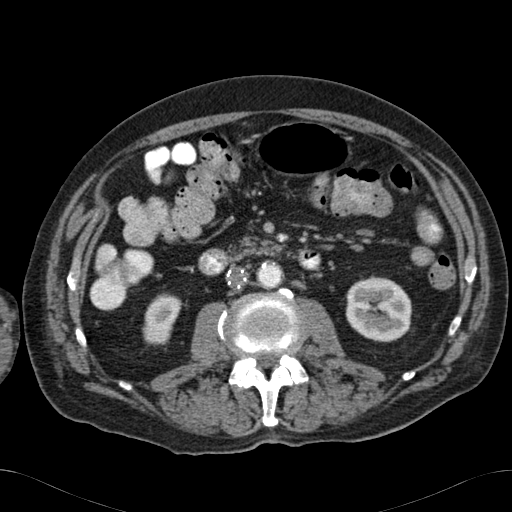
[im 117/180  soft-tissue]
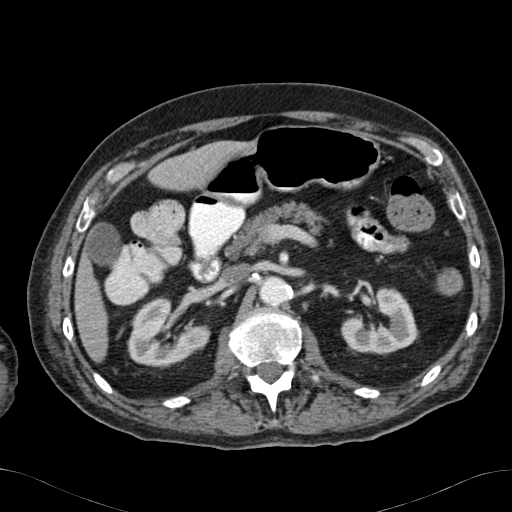
[im 117/180  bone]
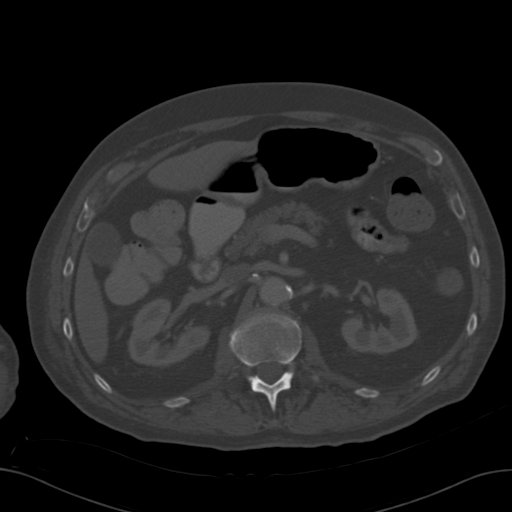
[im 133/180  soft-tissue]
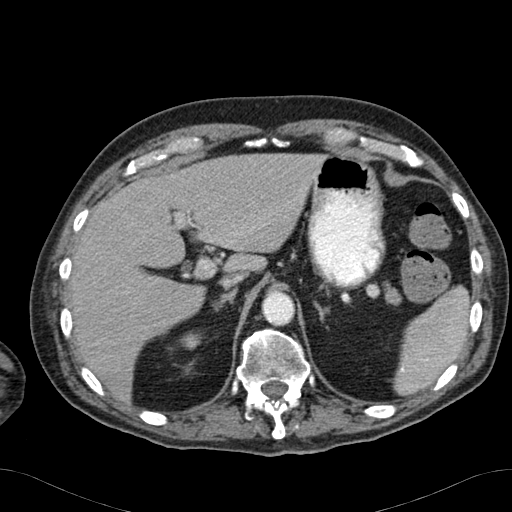
[im 141/180  soft-tissue]
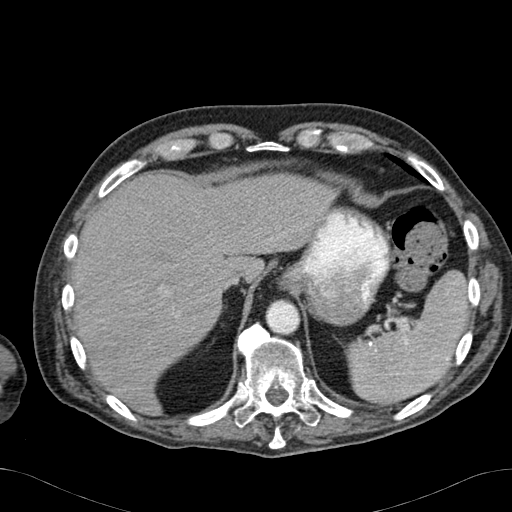
[im 148/180  lung]
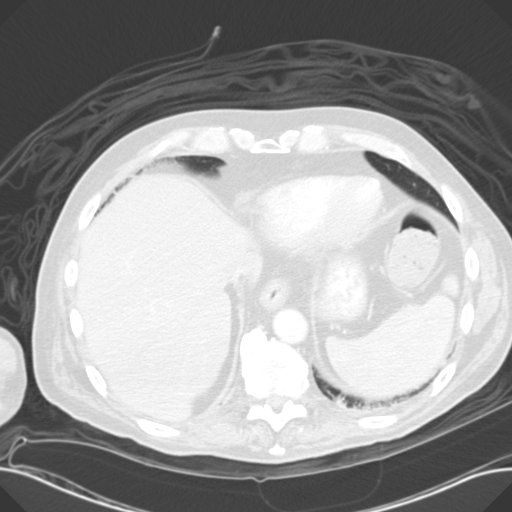
[im 156/180  soft-tissue]
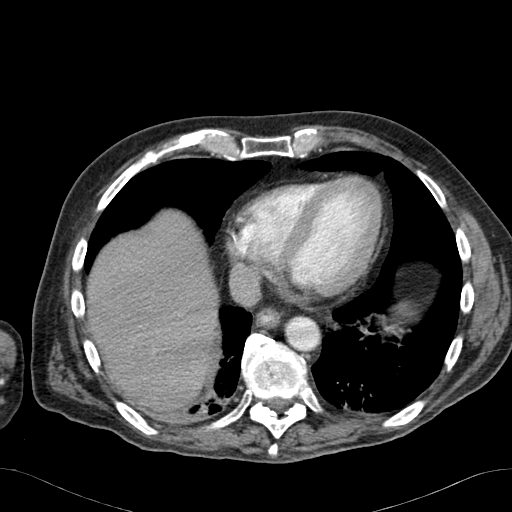
[im 156/180  lung]
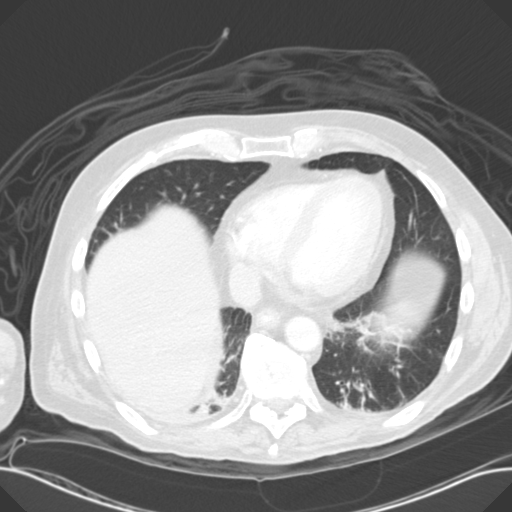
[im 164/180  lung]
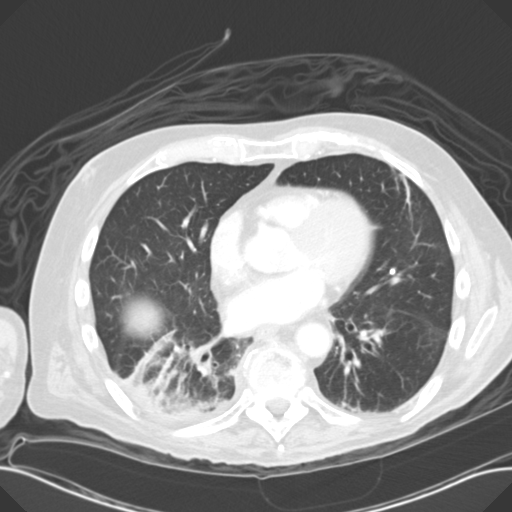
[im 172/180  soft-tissue]
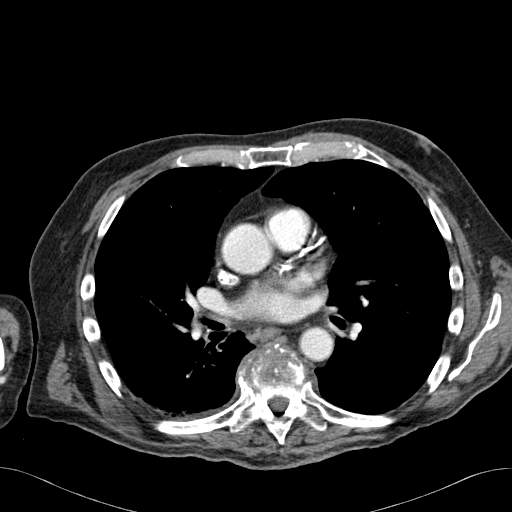
[im 172/180  lung]
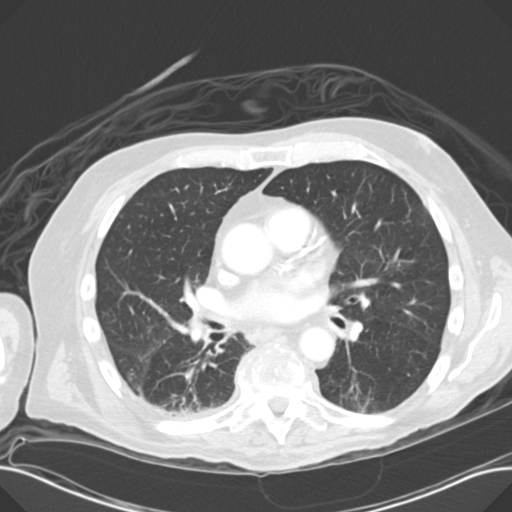

[15 of 32 positions shown; findings below may reference images not displayed]

FINDINGS: The lung bases demonstrate increased density within the dependent
portion of the right left lung bases. Findings on the right appear to be
slightly more prominent when compared to previous study, the findings on the
left have decreased in conspicuity. Again these findings may represent
atelectasis versus bibasal infiltrates. Small nodules particularly in the
right basilar density cannot be excluded. Surveillance evaluation
recommended appear

The liver is unremarkable. Multiple calcified densities appreciated within
the spleen consistent with prior granulomatous disease. The pancreas,
adrenals unremarkable. The previous described exophytic density along the
posterior peripheral left kidney is unchanged. The kidneys otherwise
unremarkable. An IVC filter is appreciated. There is no CT evidence of bowel
obstruction, enteritis, colitis, diverticulitis common nor appendicitis. Is
no evidence of abdominal aortic aneurysm. The celiac, SMA, IMA, SMV are
opacified. The portal vein was opacified and appears unremarkable. No
evidence of abdominal or pelvic free fluid, loculated fluid collections nor
pathologic sized adenopathy, or masses. A moderate to large amount of fecal
retention is appreciated within the colon.
IMPRESSION: Increased conspicuity of the right lower lobe density with
small nodular con fluency differential consideration atelectasis versus
infiltrate small nodule masses cannot be excluded.
2. Fecal retention
3. No evidence of obstructive or inflammatory abnormalities in the abdomen
or pelvis.

## 2013-04-18 LAB — LIPID PANEL
Cholesterol: 125 mg/dL (ref 0–200)
HDL Cholesterol: 26 mg/dL — ABNORMAL LOW (ref 40–60)
Ldl Cholesterol, Calc: 70 mg/dL (ref 0–100)
Triglycerides: 143 mg/dL (ref 0–200)
VLDL Cholesterol, Calc: 29 mg/dL (ref 5–40)

## 2013-04-18 LAB — HEPATIC FUNCTION PANEL A (ARMC)
Albumin: 3.1 g/dL — ABNORMAL LOW (ref 3.4–5.0)
Alkaline Phosphatase: 119 U/L (ref 50–136)
Bilirubin, Direct: <0.1 mg/dL (ref 0.00–0.20)
Bilirubin,Total: 0.3 mg/dL (ref 0.2–1.0)
SGOT(AST): 25 U/L (ref 15–37)
Total Protein: 6.2 g/dL — ABNORMAL LOW (ref 6.4–8.2)

## 2013-04-21 ENCOUNTER — Ambulatory Visit: Payer: Self-pay | Admitting: Internal Medicine

## 2013-04-25 LAB — CBC WITH DIFFERENTIAL/PLATELET
Basophil #: 0 10*3/uL (ref 0.0–0.1)
Basophil %: 0.3 %
Eosinophil #: 0.3 10*3/uL (ref 0.0–0.7)
Eosinophil %: 5.5 %
HCT: 37.1 % — ABNORMAL LOW (ref 40.0–52.0)
Lymphocyte #: 0.5 10*3/uL — ABNORMAL LOW (ref 1.0–3.6)
Lymphocyte %: 10.6 %
MCH: 33.6 pg (ref 26.0–34.0)
MCHC: 34.6 g/dL (ref 32.0–36.0)
MCV: 97 fL (ref 80–100)
Monocyte #: 0.5 x10 3/mm (ref 0.2–1.0)
Monocyte %: 11.8 %
Neutrophil %: 71.8 %
RDW: 17.1 % — ABNORMAL HIGH (ref 11.5–14.5)
WBC: 4.6 10*3/uL (ref 3.8–10.6)

## 2013-04-25 LAB — BASIC METABOLIC PANEL
BUN: 21 mg/dL — ABNORMAL HIGH (ref 7–18)
Calcium, Total: 8.7 mg/dL (ref 8.5–10.1)
Chloride: 109 mmol/L — ABNORMAL HIGH (ref 98–107)
Co2: 24 mmol/L (ref 21–32)
Creatinine: 1.14 mg/dL (ref 0.60–1.30)
EGFR (African American): >60
Osmolality: 282 (ref 275–301)
Potassium: 3.5 mmol/L (ref 3.5–5.1)
Sodium: 140 mmol/L (ref 136–145)

## 2013-07-19 ENCOUNTER — Encounter: Payer: Self-pay | Admitting: Internal Medicine

## 2013-08-18 ENCOUNTER — Ambulatory Visit: Payer: Self-pay | Admitting: Unknown Physician Specialty

## 2013-12-04 ENCOUNTER — Encounter: Payer: Self-pay | Admitting: Internal Medicine

## 2013-12-10 ENCOUNTER — Encounter: Payer: Self-pay | Admitting: Internal Medicine

## 2014-01-07 ENCOUNTER — Encounter: Payer: Self-pay | Admitting: Internal Medicine

## 2014-01-26 ENCOUNTER — Inpatient Hospital Stay: Payer: Self-pay | Admitting: Internal Medicine

## 2014-01-26 LAB — COMPREHENSIVE METABOLIC PANEL
Albumin: 3 g/dL — ABNORMAL LOW (ref 3.4–5.0)
Alkaline Phosphatase: 124 U/L — ABNORMAL HIGH
Anion Gap: 6 — ABNORMAL LOW (ref 7–16)
BUN: 17 mg/dL (ref 7–18)
Bilirubin,Total: 0.7 mg/dL (ref 0.2–1.0)
CHLORIDE: 108 mmol/L — AB (ref 98–107)
CO2: 22 mmol/L (ref 21–32)
Calcium, Total: 7.8 mg/dL — ABNORMAL LOW (ref 8.5–10.1)
Creatinine: 1.49 mg/dL — ABNORMAL HIGH (ref 0.60–1.30)
GFR CALC AF AMER: 51 — AB
GFR CALC NON AF AMER: 44 — AB
Glucose: 139 mg/dL — ABNORMAL HIGH (ref 65–99)
Osmolality: 276 (ref 275–301)
Potassium: 4.4 mmol/L (ref 3.5–5.1)
SGOT(AST): 32 U/L (ref 15–37)
SGPT (ALT): 18 U/L (ref 12–78)
Sodium: 136 mmol/L (ref 136–145)
Total Protein: 6.5 g/dL (ref 6.4–8.2)

## 2014-01-26 LAB — URINALYSIS, COMPLETE
Bacteria: NONE SEEN
Bilirubin,UR: NEGATIVE
Glucose,UR: NEGATIVE mg/dL (ref 0–75)
Ketone: NEGATIVE
LEUKOCYTE ESTERASE: NEGATIVE
Nitrite: NEGATIVE
PH: 6 (ref 4.5–8.0)
Protein: NEGATIVE
SQUAMOUS EPITHELIAL: NONE SEEN
Specific Gravity: 1.011 (ref 1.003–1.030)
WBC UR: 2 /HPF (ref 0–5)

## 2014-01-26 LAB — PROTIME-INR
INR: 1.9
Prothrombin Time: 21.6 secs — ABNORMAL HIGH (ref 11.5–14.7)

## 2014-01-26 LAB — CBC WITH DIFFERENTIAL/PLATELET
Bands: 17 %
Basophil: 1 %
COMMENT - H1-COM3: NORMAL
HCT: 31.5 % — ABNORMAL LOW (ref 40.0–52.0)
HGB: 10.9 g/dL — ABNORMAL LOW (ref 13.0–18.0)
Lymphocytes: 23 %
MCH: 37.8 pg — ABNORMAL HIGH (ref 26.0–34.0)
MCHC: 34.5 g/dL (ref 32.0–36.0)
MCV: 110 fL — AB (ref 80–100)
Metamyelocyte: 4 %
Monocytes: 6 %
Platelet: 119 10*3/uL — ABNORMAL LOW (ref 150–440)
RBC: 2.88 10*6/uL — ABNORMAL LOW (ref 4.40–5.90)
RDW: 16.7 % — ABNORMAL HIGH (ref 11.5–14.5)
Segmented Neutrophils: 49 %
WBC: 2.7 10*3/uL — ABNORMAL LOW (ref 3.8–10.6)

## 2014-01-26 LAB — MAGNESIUM: Magnesium: 1.5 mg/dL — ABNORMAL LOW

## 2014-01-26 LAB — PHOSPHORUS: PHOSPHORUS: 1.3 mg/dL — AB (ref 2.5–4.9)

## 2014-01-26 LAB — RAPID INFLUENZA A&B ANTIGENS

## 2014-01-27 LAB — BASIC METABOLIC PANEL
Anion Gap: 7 (ref 7–16)
BUN: 18 mg/dL (ref 7–18)
CHLORIDE: 108 mmol/L — AB (ref 98–107)
Calcium, Total: 7.9 mg/dL — ABNORMAL LOW (ref 8.5–10.1)
Co2: 21 mmol/L (ref 21–32)
Creatinine: 1.64 mg/dL — ABNORMAL HIGH (ref 0.60–1.30)
EGFR (African American): 46 — ABNORMAL LOW
EGFR (Non-African Amer.): 39 — ABNORMAL LOW
Glucose: 95 mg/dL (ref 65–99)
Osmolality: 274 (ref 275–301)
Potassium: 3.9 mmol/L (ref 3.5–5.1)
SODIUM: 136 mmol/L (ref 136–145)

## 2014-01-27 LAB — MAGNESIUM: MAGNESIUM: 2.3 mg/dL

## 2014-01-27 LAB — CBC WITH DIFFERENTIAL/PLATELET
Basophil #: 0 10*3/uL (ref 0.0–0.1)
Basophil %: 0.4 %
EOS ABS: 0.2 10*3/uL (ref 0.0–0.7)
Eosinophil %: 6.9 %
HCT: 27.1 % — ABNORMAL LOW (ref 40.0–52.0)
HGB: 9.4 g/dL — ABNORMAL LOW (ref 13.0–18.0)
Lymphocyte #: 0.3 10*3/uL — ABNORMAL LOW (ref 1.0–3.6)
Lymphocyte %: 11.2 %
MCH: 37.9 pg — AB (ref 26.0–34.0)
MCHC: 34.8 g/dL (ref 32.0–36.0)
MCV: 109 fL — AB (ref 80–100)
MONO ABS: 0.2 x10 3/mm (ref 0.2–1.0)
Monocyte %: 8.9 %
NEUTROS PCT: 72.6 %
Neutrophil #: 2 10*3/uL (ref 1.4–6.5)
Platelet: 100 10*3/uL — ABNORMAL LOW (ref 150–440)
RBC: 2.49 10*6/uL — ABNORMAL LOW (ref 4.40–5.90)
RDW: 17.1 % — ABNORMAL HIGH (ref 11.5–14.5)
WBC: 2.7 10*3/uL — ABNORMAL LOW (ref 3.8–10.6)

## 2014-01-27 LAB — PHOSPHORUS: Phosphorus: 2.3 mg/dL — ABNORMAL LOW (ref 2.5–4.9)

## 2014-01-28 LAB — BASIC METABOLIC PANEL
Anion Gap: 4 — ABNORMAL LOW (ref 7–16)
BUN: 17 mg/dL (ref 7–18)
CALCIUM: 8 mg/dL — AB (ref 8.5–10.1)
CHLORIDE: 110 mmol/L — AB (ref 98–107)
CREATININE: 1.51 mg/dL — AB (ref 0.60–1.30)
Co2: 20 mmol/L — ABNORMAL LOW (ref 21–32)
EGFR (Non-African Amer.): 44 — ABNORMAL LOW
GFR CALC AF AMER: 51 — AB
Glucose: 102 mg/dL — ABNORMAL HIGH (ref 65–99)
OSMOLALITY: 270 (ref 275–301)
Potassium: 3.9 mmol/L (ref 3.5–5.1)
Sodium: 134 mmol/L — ABNORMAL LOW (ref 136–145)

## 2014-01-28 LAB — CBC WITH DIFFERENTIAL/PLATELET
BASOS ABS: 0 10*3/uL (ref 0.0–0.1)
Basophil %: 0.4 %
Eosinophil #: 0.2 10*3/uL (ref 0.0–0.7)
Eosinophil %: 7.2 %
HCT: 26.3 % — AB (ref 40.0–52.0)
HGB: 9 g/dL — ABNORMAL LOW (ref 13.0–18.0)
Lymphocyte #: 0.3 10*3/uL — ABNORMAL LOW (ref 1.0–3.6)
Lymphocyte %: 10 %
MCH: 37.6 pg — ABNORMAL HIGH (ref 26.0–34.0)
MCHC: 34.4 g/dL (ref 32.0–36.0)
MCV: 109 fL — ABNORMAL HIGH (ref 80–100)
MONO ABS: 0.3 x10 3/mm (ref 0.2–1.0)
Monocyte %: 10.2 %
NEUTROS PCT: 72.2 %
Neutrophil #: 2.4 10*3/uL (ref 1.4–6.5)
Platelet: 113 10*3/uL — ABNORMAL LOW (ref 150–440)
RBC: 2.4 10*6/uL — ABNORMAL LOW (ref 4.40–5.90)
RDW: 16.7 % — AB (ref 11.5–14.5)
WBC: 3.3 10*3/uL — ABNORMAL LOW (ref 3.8–10.6)

## 2014-01-29 ENCOUNTER — Encounter: Payer: Self-pay | Admitting: Internal Medicine

## 2014-01-29 LAB — EXPECTORATED SPUTUM ASSESSMENT W GRAM STAIN, RFLX TO RESP C

## 2014-01-29 LAB — CBC WITH DIFFERENTIAL/PLATELET
BASOS ABS: 0 10*3/uL (ref 0.0–0.1)
Basophil %: 0.1 %
Eosinophil #: 0 10*3/uL (ref 0.0–0.7)
Eosinophil %: 0.2 %
HCT: 28.7 % — ABNORMAL LOW (ref 40.0–52.0)
HGB: 9.9 g/dL — AB (ref 13.0–18.0)
Lymphocyte #: 0.2 10*3/uL — ABNORMAL LOW (ref 1.0–3.6)
Lymphocyte %: 9.7 %
MCH: 37.2 pg — AB (ref 26.0–34.0)
MCHC: 34.3 g/dL (ref 32.0–36.0)
MCV: 108 fL — ABNORMAL HIGH (ref 80–100)
MONOS PCT: 3.1 %
Monocyte #: 0.1 x10 3/mm — ABNORMAL LOW (ref 0.2–1.0)
NEUTROS ABS: 1.9 10*3/uL (ref 1.4–6.5)
Neutrophil %: 86.9 %
PLATELETS: 128 10*3/uL — AB (ref 150–440)
RBC: 2.65 10*6/uL — AB (ref 4.40–5.90)
RDW: 16.9 % — ABNORMAL HIGH (ref 11.5–14.5)
WBC: 2.2 10*3/uL — ABNORMAL LOW (ref 3.8–10.6)

## 2014-01-29 LAB — BASIC METABOLIC PANEL
ANION GAP: 10 (ref 7–16)
BUN: 15 mg/dL (ref 7–18)
CHLORIDE: 111 mmol/L — AB (ref 98–107)
Calcium, Total: 8.7 mg/dL (ref 8.5–10.1)
Co2: 19 mmol/L — ABNORMAL LOW (ref 21–32)
Creatinine: 1.3 mg/dL (ref 0.60–1.30)
EGFR (African American): >60
GFR CALC NON AF AMER: 52 — AB
Glucose: 176 mg/dL — ABNORMAL HIGH (ref 65–99)
Osmolality: 285 (ref 275–301)
POTASSIUM: 4.3 mmol/L (ref 3.5–5.1)
Sodium: 140 mmol/L (ref 136–145)

## 2014-01-29 LAB — VANCOMYCIN, TROUGH: VANCOMYCIN, TROUGH: 15 ug/mL (ref 10–20)

## 2014-01-31 LAB — CULTURE, BLOOD (SINGLE)

## 2014-02-07 ENCOUNTER — Encounter: Payer: Self-pay | Admitting: Internal Medicine

## 2014-03-09 ENCOUNTER — Encounter: Payer: Self-pay | Admitting: Internal Medicine

## 2014-03-24 ENCOUNTER — Emergency Department: Payer: Self-pay | Admitting: Emergency Medicine

## 2014-03-25 LAB — COMPREHENSIVE METABOLIC PANEL
ANION GAP: 7 (ref 7–16)
Albumin: 2.7 g/dL — ABNORMAL LOW (ref 3.4–5.0)
Alkaline Phosphatase: 105 U/L
BILIRUBIN TOTAL: 0.5 mg/dL (ref 0.2–1.0)
BUN: 16 mg/dL (ref 7–18)
Calcium, Total: 7.8 mg/dL — ABNORMAL LOW (ref 8.5–10.1)
Chloride: 108 mmol/L — ABNORMAL HIGH (ref 98–107)
Co2: 20 mmol/L — ABNORMAL LOW (ref 21–32)
Creatinine: 1.35 mg/dL — ABNORMAL HIGH (ref 0.60–1.30)
EGFR (African American): 58 — ABNORMAL LOW
EGFR (Non-African Amer.): 50 — ABNORMAL LOW
GLUCOSE: 140 mg/dL — AB (ref 65–99)
Osmolality: 274 (ref 275–301)
Potassium: 3.4 mmol/L — ABNORMAL LOW (ref 3.5–5.1)
SGOT(AST): 19 U/L (ref 15–37)
SGPT (ALT): 14 U/L (ref 12–78)
Sodium: 135 mmol/L — ABNORMAL LOW (ref 136–145)
TOTAL PROTEIN: 6.5 g/dL (ref 6.4–8.2)

## 2014-03-25 LAB — CBC WITH DIFFERENTIAL/PLATELET
BASOS ABS: 0 10*3/uL (ref 0.0–0.1)
Basophil %: 0.3 %
EOS ABS: 0.2 10*3/uL (ref 0.0–0.7)
Eosinophil %: 3.5 %
HCT: 29.1 % — ABNORMAL LOW (ref 40.0–52.0)
HGB: 9.9 g/dL — ABNORMAL LOW (ref 13.0–18.0)
LYMPHS PCT: 3.8 %
Lymphocyte #: 0.2 10*3/uL — ABNORMAL LOW (ref 1.0–3.6)
MCH: 37.3 pg — ABNORMAL HIGH (ref 26.0–34.0)
MCHC: 33.9 g/dL (ref 32.0–36.0)
MCV: 110 fL — ABNORMAL HIGH (ref 80–100)
Monocyte #: 0.4 x10 3/mm (ref 0.2–1.0)
Monocyte %: 7.4 %
NEUTROS ABS: 4.2 10*3/uL (ref 1.4–6.5)
Neutrophil %: 85 %
Platelet: 175 10*3/uL (ref 150–440)
RBC: 2.64 10*6/uL — ABNORMAL LOW (ref 4.40–5.90)
RDW: 15.7 % — AB (ref 11.5–14.5)
WBC: 4.9 10*3/uL (ref 3.8–10.6)

## 2014-03-25 LAB — URINALYSIS, COMPLETE
BACTERIA: NONE SEEN
BILIRUBIN, UR: NEGATIVE
Glucose,UR: NEGATIVE mg/dL (ref 0–75)
Ketone: NEGATIVE
LEUKOCYTE ESTERASE: NEGATIVE
Nitrite: NEGATIVE
PH: 5 (ref 4.5–8.0)
Protein: 100
Specific Gravity: 1.029 (ref 1.003–1.030)
Squamous Epithelial: NONE SEEN
WBC UR: 2 /HPF (ref 0–5)

## 2014-03-30 LAB — CULTURE, BLOOD (SINGLE)

## 2015-02-26 NOTE — H&P (Signed)
PATIENT NAME:  Aaron Rose, Aaron Rose MR#:  [id number] DATE OF BIRTH:  [birth year]  DATE OF ADMISSION:  08/10/2012  PRIMARY CARE PHYSICIAN: Frazier Richards, MD  CHIEF COMPLAINT: Left-sided chest pain, atypical.   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 The patient has had some radiation of the brain with also methotrexate chemotherapy for what he has underwent the third cycle at [facility]. His fourth cycle is due to be administered tomorrow by Dr. Lynnette Caffey at [facility] and his daughter who is a pediatrician over here is trying to contact him for more information and follow-up with this issue. The patient has been recently admitted here on 07/07/2012 due to fevers and was discharged on 07/12/2012 with the diagnosis fever due to lymphoma and evidence of pneumonia on chest x-ray, but not clear diagnosis. The patient was treated with Levaquin 500 mg for two more days after discharge. The patient was transferred back to the skilled nursing facility where he lives and after that he was admitted to [facility] for his third chemotherapy with methotrexate and he was discharged on 07/22/2012. The patient has been doing his normal activities at the skilled nursing facility having activity with PT and OT and he states he has recovered some of his strength but he is not satisfied with his progress. The patient was his normal self last night before going to bed, but he states that he was woke up in the middle of the night with 10/10 chest pain located on his left side which was stabbing, it was continuous and never went away. It has started to fade out to the point that right now it is 4/10. The pain was described as stabbing and it did not have any radiation. The patient states that he did not have any associated nausea,  vomiting, sweating, or difficulty breathing. It was only absolutely chest pain what he was feeling. Two days prior to that, the patient was feeling different, he was not feeling right, but he states it was not pain he just cannot describe what the feeling was. Other than that, the patient states that he does not have any other symptoms. He did not have any oxygen desaturations. He is actually at room air right now with oxygen saturations being above 96 and his pain, as mentioned above, now is 4/10. The patient is admitted for treatment of bilateral PE diagnosed by CT scan with IV contrast showing bilateral pulmonary emboli and atelectasis and/or infiltrate noticed on the lung bases. The patient is being started on a heparin drip and he is hemodynamically stable.  REVIEW OF SYSTEMS: A 12-point review of systems is done. CONSTITUTIONAL: The patient denies any fever, although he was admitted on 07/07/2012 for fevers; those have resolved. He was treated with Levaquin. Positive fatigue and right-sided weakness. Denies any other pains, other than in the chest. No significant weight loss or weight gain. EYES: Denies any blurry vision. No redness of the eyes. No inflammation. ENT: Denies any tinnitus, ear pain, postnasal drip, or sneezing. RESPIRATORY: Denies any cough, wheezing, or hemoptysis. No shortness of breath at all. No increased work of breathing. No chronic obstructive pulmonary disease. CARDIOVASCULAR: Chest pain as mentioned above, pleuritic, increased with deep breathing or palpation. No syncopal episodes. No edema. GASTROINTESTINAL: No nausea, vomiting, jaundice, rectal bleeding, melena, or hematemesis. GU: Denies  any dysuria or hematuria. He wears a diaper and has mild control of his sphincters, but most of the time he just does not have any time to  make it to the bathroom so he wears diapers for defecation and urination. No prostatitis. No sexually transmitted diseases. ENDOCRINE: No polyuria, polydipsia,  polyphagia, or cold or heat intolerance. No increased thirst. No thyroid problems. HEME/LYMPH: Positive T-cell lymphoma. No bleeding. He does have chronic anemia. No swollen glands. SKIN: No significant rashes or lesions. No edema. No erythema of the lower extremities. MUSCULOSKELETAL: Denies any significant pain in joints or back. No swelling of the joints. No gout. NEURO: No numbness. Positive weakness of the right side of his body, but he states that he has good sensation. No CVAs. No transient ischemic attacks. No seizure. Positive metastasis of central nervous system. PSYCHIATRIC: Negative for insomnia. Negative nervousness or bipolar disorder.   PAST MEDICAL HISTORY:  1. T-cell lymphoma, angioimmunoblastic. The patient had six treatments with CHOP chemotherapy and currently he has been receiving radiotherapy and methotrexate chemotherapy for his CNS metastasis.  2. CNS metastasis of T-cell lymphoma.  3. Right hemiparesis.  4. Hypertension.  5. Hyperlipidemia.  6. Gastroesophageal reflux disease.  7. Pancytopenia related to chemotherapy.  8. History of stage II decubitus, now resolved.  9. History of acute kidney injury after chemotherapy, now resolved.  10. Fungal skin lesions.  [name], now resolved.  12. History of hyperglycemia secondary to steroids, now resolved.  13. Chronic anemia.  14. Erectile dysfunction.  15. Chronic constipation.  16. Cognitive deficits.   ALLERGIES: No known drug allergies.   CURRENT MEDICATIONS:  1. Baclofen 10 mg every six hours p.r.n. muscle spasms/hiccups. 2. Bisacodyl suppository p.r.n.  3. Chlorpromazine 25 mg orally every six hours p.r.n. hiccups. 4. Docusate 100 mg twice daily for constipation. 5. Ketoconazole 2% cream twice daily on affected areas. 6. Loperamide 12 mg p.r.n. diarrhea.  7. Magnesium oxide 400 mg tablet once daily.  8. Metoprolol 25 mg twice daily. 9. Ondansetron p.r.n. nausea. 10. Protonix 40 mg twice daily.   11. Potassium chloride 10 mEq once daily.  12. Tylenol 325 mg p.r.n. fever or pain.   SOCIAL HISTORY: The patient has recently moved from [place] after his diagnosis of T-cell lymphoma and brain metastases to be close to his family. His daughter is a pediatrician here in the hospital. 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 He has never drank large amounts of alcohol. He has never smoked. Right now he resides in a skilled nursing facility to where he moved directly to from [place].   FAMILY HISTORY: Positive for bipolar disease and depression. He denies any coronary artery disease or cancer in his family.   PAST SURGICAL HISTORY:  1. Vasectomy.  2. Tonsillectomy.  3. Port-A-Cath placement.   PHYSICAL EXAMINATION:   VITAL SIGNS: Blood pressure is 114/69, pulse 90, respiratory rate 20, and temperature 98.7. He is on room air at 96% oxygen saturation.  GENERAL: The patient is alert and oriented x3. There is no significant respiratory distress or acute distress in general. He is hemodynamically stable. He is cooperative and pleasant.  HEENT: Pupils are equal and reactive. Extraocular movements are intact. Mucosa is moist. Fundal exam did not show any papilledema, no oral lesions, no oropharyngeal exudates, and no thrush. Anicteric sclerae. Pink conjunctivae.   NECK: Supple. No JVD. No thyromegaly. No adenopathy. No carotid bruits are auscultated. Normal range of motion.  CARDIOVASCULAR: Regular rate and rhythm. No murmurs, rubs, or gallops are appreciated at this moment. Pulses are +2. No peripheral edema. Apical heart tones are palpated in the midclavicular line.   PULMONARY: Lungs are clear without any wheezing or crepitus. No dullness to percussion.   ABDOMEN: Soft, nontender, and nondistended. No hepatosplenomegaly. No masses. Bowel sounds are positive. No rebound.   GENITAL: Negative for external lesions. No evidence of decubitus ulcers on the  back. No pinpoint tenderness of spinal processes.   EXTREMITIES: No edema, no cyanosis, and no clubbing. Capillary refill less than 3. Pulses +2.   NEUROLOGIC: Right side hemiparesis which is chronic. Strength is 0/5 on the right side. The left side is 5/5. Deep tendon reflexes are increased on the right sided, +3/+2, and about +1/+2 on the left side. There is no spasticity. There is some muscular atrophy on the right side. Cranial nerves are otherwise intact.   SKIN: No significant erythema, edema, or induration. No significant skin lesions. Decubitus ulcers have healed well.   LYMPH: Negative for lymphadenopathy in supraclavicular or axillary areas.   PSYCH: Mood - the patient has some flat affect.   MUSCULOSKELETAL: No significant effusions of the joints or swelling.   LABORATORY AND DIAGNOSTIC DATA: EKG: Normal sinus rhythm. No ST depression or ST elevation.  Glucose 114, BUN 20, sodium 140, and calcium 8.3. Magnesium 1.4. D-dimer above 6. CK 22 and MB 1.1. Hemoglobin 10, hematocrit 28, platelets 219, and white count 7.5. INR 1.8.  PT 21.  ASSESSMENT AND PLAN: Mr. Cathey is a 81 year old gentleman with history of T-cell lymphoma with brain metastases resulting in right hemiparesis who also has hypertension, gastroesophageal reflux disease, and hyperlipidemia. He is a skilled nursing home facility patient and came in today with history of chest pain that started overnight and woke him up. 1. Bilateral pulmonary embolism. The patient has risk factors for embolism including his cancer and immobilization. The patient was not currently on prophylaxis at the nursing home. He is being treated here with heparin drip and we are going to try to contact his hematologist, Dr. Lynnette Caffey, to see if there is any preference for anticoagulation. Since the patient has brain metastases, we have to be careful in order to avoid any brain bleeding. If there are any changes in his mental status or neurologic  examination, anticoagulation needs to be stopped and CT scan obtained. Other than that, the patient is hemodynamically stable, no oxygen desaturation, mostly pulmonary embolism detected on CT scan. No signs of deep vein thrombosis, although we are going to get ultrasound of the four extremities to rule out any deep vein thromboses.  2. T-cell lymphoma. The patient has received CHOP chemotherapy and his disease has remained stable. He sees Dr. Lynnette Caffey at [facility] and he was discharged on 07/22/2012 for chemotherapy of the CNS metastasis.  3. CNS metastases, receiving chemotherapy with methotrexate. The patient is due to have his fourth round tomorrow. We are going to try to contact Dr. Lynnette Caffey for further guidance.  4. Hypertension. The patient has well-controlled hypertension. He is currently taking metoprolol. We are going to continue this medication for now.  5. Gastroesophageal reflux. Continue twice daily Protonix.  6. Hyperlipidemia, currently diet controlled.  7. History of hiccups . The patient is taking baclofen and chlorpromazine p.r.n.  8. Hypomagnesemia. Replace and continue treatment.  9. History of thrombocytopenia related to chemotherapy. At this moment, his white count is normal and his platelets are normal.  10. Anemia of chronic disease  related to his T-cell lymphoma. It is controlled with hemoglobin being around 10. We will let hematology/oncology follow-up on this issue as well.      11. History of decubitus ulcer, now resolved.  We are going to continue with general measures to avoid any further ulcers.  12. Deep vein thrombosis prophylaxis. At this moment, the patient is being treated with heparin.  13. Full Code TIME SPENT: 50 minutes. ____________________________ Kenedy Sink, MD rsg:slb D: 08/10/2012 14:55:28 ET T: 08/10/2012 15:27:52 ET JOB#: [id number]  cc: [place] Sink, MD, <Dictator> Ocie Cornfield. Ouida Sills, MD Roselie Awkward America Brown  MD ELECTRONICALLY SIGNED 08/10/2012 19:07

## 2015-02-26 NOTE — Discharge Summary (Signed)
PATIENT NAME:  Aaron Rose, Aaron Rose MR#:  [id number] DATE OF BIRTH:  01-Apr-1933  DATE OF ADMISSION:  08/31/2012 DATE OF DISCHARGE:  09/02/2012   DISCHARGE DIAGNOSES:  1. Congestive heart failure, acute diastolic. 2. Fluid overload associated with chemotherapy at [facility], discharged the day of admission here from [place] likely contributing to above.  3. T-cell lymphoma necessitating chemotherapy.  4. Accelerated hypertension, now improved.  5. Encephalopathy from chemotherapy, now improved.  6. Recent pulmonary emboli, on Xarelto.   DISCHARGE MEDICATIONS:  1. Tylenol 1000 mg q.4 hours p.r.n.  2. Baclofen 10 mg t.i.d. p.r.n. hiccups.  3. Carvedilol 3.125 mg p.o. b.i.d.  4. Thorazine 25 mg q.i.d. p.r.n. hiccups. 5. Colace 100 mg p.o. b.i.d. p.r.n. constipation.  6. Lisinopril 10 mg p.o. daily.  7. Magnesium oxide 400 mg daily.  8. Norco 5/325 1 to 2 p.o. q.4 hours p.r.n.  9. Pantoprazole 40 mg p.o. b.i.d.  10. Potassium chloride 20 mEq p.o. daily. 11. Xarelto 20 mg p.o. daily but please be careful with this. Will need to stop if his platelet count drops below 50,000.  12. Torsemide 5 mg p.o. daily.  DIET: Regular.   CONSULT: PT/OT consult for weakness.   DISCHARGE INSTRUCTIONS: He will need a CBC and a Met-B on Tuesdays and Thursdays for the next three weeks.   HISTORY AND PHYSICAL: Please see detailed history and physical done on admission.   HOSPITAL COURSE: The patient was admitted with fluid overload approximately 30 pounds over dry weight. He was diuresed briskly, lost a good deal of weight. His edema was mostly gone except for his right side where he has hemiparesis. He was weaned off his oxygen which he needed here with the congestive heart failure. Echocardiogram again showed normal LV function so this was presumed diastolic heart failure as noted. Chest x-ray did show some pulmonary edema on admission. Troponins were negative. Final white blood cell count was 3700, slightly depressed  from his chemotherapy as well as hemoglobin 10.7. His platelets remained normal at 172. These may be followed closely given Xarelto for recent events. Magnesium level is minimally depressed at 1.7 today. He will remain on oral magnesium. Potassium was coming up even with brisk IV diuresis and potassium we have given him. It should come up further with the meds as noted above. Pulse oximetry was 94% on room air on yesterday's date. He will be discharged back to the skilled nursing skilled facility.   TIME SPENT: It took approximately 35 minutes to do discharge tasks today.   ____________________________ Ocie Cornfield. Ouida Sills, MD [id number]:drc D: 09/02/2012 08:27:17 ET T: 09/02/2012 08:50:13 ET JOB#: [id number]  cc: Ocie Cornfield. Ouida Sills, MD, <Dictator> Kirk Ruths MD ELECTRONICALLY SIGNED 09/02/2012 9:08

## 2015-02-26 NOTE — Op Note (Signed)
PATIENT NAME:  Aaron Rose, Aaron Rose MR#:  [id number] DATE OF BIRTH:  1933/03/15  DATE OF PROCEDURE:  08/11/2012  PREOPERATIVE DIAGNOSES:  1. Pulmonary embolus.  2. Bilateral lower extremity deep vein thrombosis residual.  3. Lymphoma with possible brain mets.  4. Anemia.  5. Poor candidate for long-term anticoagulation.   POSTOPERATIVE DIAGNOSES: 1. Pulmonary embolus.  2. Bilateral lower extremity deep vein thrombosis residual.  3. Lymphoma with possible brain mets.  4. Anemia.  5. Poor candidate for long-term anticoagulation.   PROCEDURES:  1. Ultrasound guidance for vascular access, left femoral vein.  2. Catheter placement into inferior vena cava.  3. Inferior venacavogram.  4. Placement of a [employer] Meridian IVC filter.   SURGEON: Algernon Huxley, MD   ANESTHESIA: Local with 2 mg of Versed.   ESTIMATED BLOOD LOSS: Minimal.   FLUOROSCOPY TIME: Less than one minute.   CONTRAST USED: 15 mL.   INDICATION FOR PROCEDURE: This is a gentleman with an advanced malignancy and possible brain mets. He has severe anemia and he is getting ready to start chemotherapy. He has been diagnosed with pulmonary embolus and residual bilateral lower extremity DVT. The medical service did not think he would be a candidate for long-term anticoagulation and was at high risk of bleeding and has extensive residual thrombus present. For these reasons we are asked to place an IVC filter. Risks and benefits were discussed with the patient and informed consent was obtained.   DESCRIPTION OF PROCEDURE: The patient was brought to the Vascular Interventional Radiology Suite. Groins were shaved and prepped and a sterile surgical field was created. The left femoral vein was visualized with ultrasound and found to be patent. There was thrombus within the right common femoral vein. The left femoral vein was accessed without difficulty with a Seldinger needle. Under direct ultrasound guidance permanent image was recorded. J-wire  was placed. After skin nick and dilatation, the delivery sheath was placed into the inferior vena cava and inferior venacavogram was performed. This showed a patent vena cava at the level of the renal veins at the midportion of L1. The filter was then deployed with the top hooked at the L1-L2 interspace. The delivery sheath was removed. Pressure was held. Sterile dressing was placed. The patient tolerated the procedure well and was taken to the recovery room in stable condition.   ____________________________ Algernon Huxley, MD [id number]:drc D: 08/11/2012 11:20:49 ET T: 08/11/2012 11:41:46 ET JOB#: [id number]  cc: Algernon Huxley, MD, <Dictator> Algernon Huxley MD ELECTRONICALLY SIGNED 08/15/2012 11:18

## 2015-02-26 NOTE — Discharge Summary (Signed)
PATIENT NAME:  Aaron Rose, Aaron Rose MR#:  [id number] DATE OF BIRTH:  01-24-1933  DATE OF ADMISSION:  07/07/2012 DATE OF DISCHARGE:  07/12/2012  DISCHARGE DIAGNOSES:  1. Fever, likely lymphoma related. 2. Evidence of pneumonia on chest x-ray, no clear diagnosis.  3. Lymphoma with central nervous system involvement.   DISCHARGE MEDICATIONS:  1. Tylenol p.r.n. fever.  2. Aspirin 81 mg daily.  3. Magnesium oxide 400 mg daily for seven days, then discontinue.  4. Metoprolol 25 mg b.i.d.  5. Zofran 4 mg p.o. every four hours p.r.n.  6. Pantoprazole 40 mg daily. 7. Docusate 800 mg b.i.d.  8. Levaquin 500 mg daily for three days.  9. Bisacodyl 10 mg per rectum p.r.n.   HISTORY AND PHYSICAL: Please see detailed History and Physical done on admission.   HOSPITAL COURSE: The patient was admitted with fever, recent pneumonia, being treated at [facility]. [facility] oncologist asked for him to come to our hospital to start his workup. Following that they could not take him as admission given full hospital per their report.  At any rate, he remained here with work-up being done.  Infectious disease and oncology consults were done here. Cultures were negative. He had an equivocal chest x-ray and possible atelectasis versus pneumonia, several being done with no clear determination. He was put on Levaquin and vancomycin by infectious disease, but infectious disease wished to stop his vancomycin if cultures were negative, which they are. He has felt well throughout without any worsening of symptomatology. His counts have improved since last chemotherapy due to a white count of 8200 today, hemoglobin 8.1, platelets 303. He is mildly lymphopenic with absolute lymphocyte count of 0.7. Monocytes are slightly increased consistent with his known disease. These have been stable over the last few days. Pulse oximetry is normal as well. He has an appointment in two days with his oncologist at [facility]. We will transfer him back to the skilled  nursing facility today after discussing options of staying here versus going there with the patient the patient's daughter who is a pediatrician, etc. I believe this is reasonable. He has been some stable for more than a week with his fever, on and off antibiotics. Fever is likely from the lymphoma.     ____________________________ Ocie Cornfield. Ouida Sills, MD mwa:bjt D: 07/12/2012 07:56:00 ET T: 07/12/2012 09:11:24 ET JOB#: [id number]  cc: Ocie Cornfield. Ouida Sills, MD, <Dictator> Kirk Ruths MD ELECTRONICALLY SIGNED 07/12/2012 20:17

## 2015-02-26 NOTE — Discharge Summary (Signed)
PATIENT NAME:  Aaron Rose, Aaron Rose MR#:  [id number] DATE OF BIRTH:  08-30-1933  DATE OF ADMISSION:  08/10/2012 DATE OF DISCHARGE:  08/12/2012  DISCHARGE DIAGNOSES:  1. Bilateral pulmonary emboli.  2. Metastatic T-cell lymphoma with neurologic involvement.  3. Chest pain responding to Norco and anticoagulants   DISCHARGE MEDICATIONS:  1. Lovenox 75 mg every 12 hours which, 1 mg/kg dosing, which will need to go for approximately two weeks and then will switch to 1.5 mg/kg daily unless instructed otherwise by his hematologist/oncologist at [facility].  2. Tylenol 325 mg daily.  3. Potassium chloride 10 mEq daily.  4. Norco 5/325, 1 to 2 every four hours p.r.n.  5. Pantoprazole 40 mg b.i.d.  6. Zofran 4 mg q.4 hours p.r.n. 7. Metoprolol 25 mg b.i.d.  8. Magnesium oxide 400 mg daily. 9. Loperamide 2 mg every six hours p.r.n.  10. Ketoconazole topical lotion 2% b.i.d.  11. Docusate sodium 100 mg daily.  12. Bisacodyl PR q.12 hours p.r.n.  13. Baclofen 10 mg p.o. q.6 hours p.r.n. hiccups.  14. Thorazine 25 mL p.o. q.6 hours p.r.n. hiccups.  15. Standing orders as per routine at [facility].   DIET: Regular diet.   ACTIVITY LEVEL: As tolerated at this point after 48 hours of treatment for the deep vein thrombosis.   HISTORY AND PHYSICAL: Please see detailed history and physical done on admission.  HOSPITAL COURSE: Patient admitted with chest pain, found to have bilateral pulmonary emboli as noted. Started on heparin IV. Vascular was consulted by the admitting doctor and inferior vena cava filter was put in given his T-cell lymphoma and the fact that he will be on and off chemotherapy needing protection in the meantime. After discussion have decided to treat him with Lovenox along at this point given his upcoming chemotherapy. His hematologist/oncologist can decide on Xarelto versus other medication in the future. However, on and off Lovenox may be the easiest way to do this and the safest way until  he is done with his chemotherapy. His pain responded well to Norco, we will continue that and write a prescription for #30 with six months worth of refills of that for the facility to fill.   TIME SPENT: Took approximately 36 minutes to do discharge tasks today.   ____________________________ Ocie Cornfield. Ouida Sills, MD mwa:cms D: 08/12/2012 08:14:47 ET T: 08/12/2012 08:38:41 ET JOB#: [id number]  cc: Ocie Cornfield. Ouida Sills, MD, <Dictator> Kirk Ruths MD ELECTRONICALLY SIGNED 08/15/2012 8:22

## 2015-02-26 NOTE — Consult Note (Signed)
Asked to see patient with significant tumor burden, possible brain mets. Admitted with symptomatic PE and residual LE DVT.  He has a high risk of further thromboembolic disease and has a high risk with long term anticoagulation.  Agree that IVC filter is reasonably, and will place today.    Electronic Signatures: Algernon Huxley (MD)  (Signed on 03-Oct-13 10:47)  Authored  Last Updated: 03-Oct-13 10:47 by Algernon Huxley (MD)

## 2015-02-26 NOTE — Consult Note (Signed)
Impression: 81yo WM w/ h/o T-cell lymphoma with brain metastases admitted with fever.   He has not received any chemotherapy recently other then methotrexate.  His WBC is ok, although functionality may be questionable given his underlying lymphoma.  He has no focal symptoms and he has been afebrile in house.  He has no erythema or tenderness around his port. BCx  are negative so far. Continue levofloxacin and vanco.  If BCx remain negative, would stop the vanco tomorrow. If he continues to be afebrile without any other focal symptoms, would give him a total of 10 days of levofloxacin. If he spikes again would consider CT abd/pelvis and repeating a PA/LAT CXR.  Electronic Signatures: Jesyca Weisenburger, Heinz Knuckles (MD)  (Signed on 30-Aug-13 16:41)  Authored  Last Updated: 30-Aug-13 16:41 by Bianey Tesoro, Heinz Knuckles (MD)

## 2015-02-26 NOTE — H&P (Signed)
PATIENT NAME:  Aaron Rose, Aaron Rose MR#:  [id number] DATE OF BIRTH:  04-13-1933  DATE OF ADMISSION:  08/31/2012  PRIMARY CARE PHYSICIAN: Dr. Ouida Sills   CHIEF COMPLAINT: Chest pain, episodic shortness of breath, and weight gain.   HISTORY OF PRESENT ILLNESS: The patient is a 81 year old man with a complicated history of metastatic T-cell lymphoma to the brain with right-sided paralysis and complicated with a pulmonary embolism on Xarelto. He was in the hospital on Friday, Saturday, and Sunday getting chemotherapy and he was discharged back to his facility quicker than usual. Family had noted that he is normally 175 pounds and is up to 208 pounds at this point. The patient complains of chest pain that is episodic lasting seconds to minutes at a time and goes away. As per family, some arm pain, some shortness of breath. Hospitalist services were contacted for evaluation for heart failure.   PAST MEDICAL HISTORY:  1. T-cell lymphoma, undergoing chemotherapy. 2. Brain metastasis T-cell lymphoma. 3. Right-sided paralysis. 4. Hypertension. 5. Hyperlipidemia. 6. Gastroesophageal reflux disease. 7. Pancytopenia related to chemotherapy. 8. Constipation. 9. Actinic keratosis. 10. Pulmonary embolism.   PAST SURGICAL HISTORY:  1. IVC filter. 2. Port-A-Cath placement.  3. Tonsillectomy.  4. Vasectomy. 5. Ear surgery.   ALLERGIES: No known drug allergies.   MEDICATIONS:  1. Ativan p.r.n.  2. Colace 100 mg twice a day as needed for constipation.  3. Magnesium 400 mg daily.  4. Metoprolol 25 mg twice a day.  5. Zofran 4 mg every four hours as needed for nausea.  6. Oxycodone 5 mg every eight hours. 7. Protonix 40 mg twice a day. 8. Tylenol 650 mg every four hours as needed for pain. 9. Xarelto 20 mg at bedtime.   SOCIAL HISTORY: Lives at skilled nursing facility. 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    FAMILY HISTORY: Mother died at 16 of old age, had heart disease. Father died at 61, mental  health issues.  REVIEW OF SYSTEMS: No fever, chills, or sweats. Positive for weight gain. Positive for right-sided weakness. EYES: He does have left eye visual field disturbances and has glasses. EARS, NOSE, MOUTH, AND THROAT: Decreased hearing. No runny nose. No postnasal drip. No sore throat. No difficulty swallowing. CARDIOVASCULAR: Positive for chest pain. No palpitations. RESPIRATORY: Positive for shortness of breath. No cough. No sputum. No hemoptysis. GASTROINTESTINAL: Positive for nausea and vomiting. No abdominal pain. No diarrhea. Positive for constipation. No bright red blood per rectum. No melena. GENITOURINARY: No burning on urination. No hematuria. MUSCULOSKELETAL: No joint pain. INTEGUMENTARY: No ulcers. NEUROLOGIC: No fainting or blackouts. Right-sided paralysis. PSYCHIATRIC: No anxiety or depression. ENDOCRINE: No thyroid problems. HEMATOLOGIC/LYMPHATIC: History of anemia.   PHYSICAL EXAMINATION:   VITAL SIGNS: Temperature 98.8, pulse 60, respirations 18, blood pressure 191/85, pulse oximetry 100% on oxygen.   GENERAL: No respiratory distress.   EYES: Conjunctivae and lids normal. Pupils equal, round, and reactive to light. Extraocular muscles intact. No nystagmus.   EARS, NOSE, MOUTH, AND THROAT: Tympanic membranes no erythema. Nasal mucosa no erythema. Throat no erythema. No exudate seen. Lips and gums no lesions.   NECK: No JVD. No bruits. No lymphadenopathy. No thyromegaly. No thyroid nodules palpated.   RESPIRATORY: Lungs rales bilateral bases. No use of accessory muscles to breathe. No rhonchi, rales, or wheeze heard.   CARDIOVASCULAR: S1, S2 normal. No gallops, rubs, or murmurs heard. Carotid upstroke 2+ bilaterally. No bruits. Dorsalis pedis pulses 2+ bilaterally. 3+ bilateral lower extremity pitting edema.   ABDOMEN: Soft, nontender. No  organomegaly/splenomegaly. Normoactive bowel sounds. No masses felt.   LYMPHATIC: No lymph nodes in the neck.   MUSCULOSKELETAL: 3+  edema. No clubbing. No cyanosis.   SKIN: No ulcers seen.   NEUROLOGIC: Cranial nerves II through XII grossly intact. Deep tendon reflexes 2+ bilateral lower extremities.   PSYCHIATRIC: The patient is oriented to person, place, and time.   LABORATORY AND RADIOLOGICAL DATA: Chest x-ray shows patchy increased peripheral vascular congestion bilaterally   BNP 5546. White blood cell count 6.6, hemoglobin and hematocrit 9.9 and 29.6, platelet count 207, glucose 118, BUN 26, creatinine 0.93, sodium 145, potassium 3.7, chloride 114, CO2 20, calcium 8.2. Liver function tests albumin low at 2.9. AST slightly elevated at 41. Troponin negative. Lipase normal range.   EKG normal sinus rhythm, 83 beats per minute, left atrial enlargement. No acute ST-T wave changes.    ASSESSMENT AND PLAN:  1. Acute diastolic congestive heart failure versus fluid overload from IV fluid hydration with chemotherapy. Will start IV Lasix 20 mg IV twice a day. Continue the patient's metoprolol. Add low dose lisinopril. I will not repeat an echocardiogram since last EF was 55% on recent echocardiogram.  2. Hypertension. Blood pressure elevated right now. Continue metoprolol. Add lisinopril and Lasix and continue to monitor.  3. Metastatic T-cell lymphoma. Follow-up with his oncologist as outpatient for continued chemotherapy. The patient is a FULL CODE. 4. Right-sided paralysis with metastatic lesions. Continue physical therapy at rehab.  5. Anemia. Continue to monitor while here.  6. Gastroesophageal reflux disease. On Protonix.  7. History of PE, on Xarelto.  8. Will admit to telemetry. Get serial cardiac enzymes.   TIME SPENT ON ADMISSION: 55 minutes.   CODE STATUS: The patient is a FULL CODE.   ____________________________ Tana Conch. Leslye Peer, MD rjw:drc D: 08/31/2012 03:02:58 ET T: 08/31/2012 06:43:59 ET JOB#: [id number]  cc: Tana Conch. Leslye Peer, MD, <Dictator> Ocie Cornfield. Ouida Sills, MD Marisue Brooklyn  MD ELECTRONICALLY SIGNED 09/06/2012 4:44

## 2015-02-26 NOTE — Consult Note (Signed)
PATIENT NAME:  Aaron Rose, Aaron Rose MR#:  [id number] DATE OF BIRTH:  12/20/1932  DATE OF CONSULTATION:  07/08/2012  REFERRING PHYSICIAN:  Frazier Richards, MD  CONSULTING PHYSICIAN:  Heinz Knuckles. Kentrell Guettler, MD  REASON FOR CONSULTATION: Fever.   HISTORY OF PRESENT ILLNESS: The patient is a 81 year old white man with a past history significant for T-cell leukemia with metastasis to the brain, who recently received methotrexate in the last month or so but has not received any other recent chemotherapy, and who began having fevers over the last 4 or 5 days. The patient is living in assisted living. He had been having some general weakness and occasional confusion but had been working well with physical therapy, although it exhausted him. He began having some low-grade fevers that then became higher on the day prior to admission up to 102.0. He denies any other focal symptoms. He does not have any headaches, sinus congestion or sore throat. He has not had any cough or shortness of breath. He denies any gastrointestinal symptoms other than recent constipation. He has not had any rashes other than some pressure ulcers which have been healing recently. He was brought to the hospital and blood cultures were obtained. He was started initially on Levaquin and vancomycin has been added. He has not had any further fever since admission. Blood cultures have remained negative to date.   ALLERGIES: None.   PAST MEDICAL/SURGICAL HISTORY:  1. T-cell lymphoma. He received CHOP therapy early and developed brain metastases. He subsequently received focal radiation and is now receiving intermittent methotrexate. He has right-sided weakness related to the metastases.  2. Hypertension.  3. Gastroesophageal reflux.  4. Hypercholesterolemia.  5. Pressure ulcers in the sacrum.  6. Port-A-Cath placement in the right chest. He has had no problems with his Port-A-Cath.   SOCIAL HISTORY: The patient lives with his wife. He does not  drink. He does not smoke.   FAMILY HISTORY: Positive for depression and valvular heart disease.    REVIEW OF SYSTEMS: GENERAL: Positive for fevers and some chills. No sweats. Some mild malaise. HEENT: No headaches. No sinus congestion. No sore throat. NECK: No stiffness. No swollen glands. RESPIRATORY: No cough, no shortness of breath. No sputum production. CARDIAC: No chest pains or palpitations. GI: No nausea, no vomiting, no abdominal pain. Positive constipation. No diarrhea. GENITOURINARY: No symptoms. MUSCULOSKELETAL: He has got no joint or muscle complaints. NEUROLOGIC: He has some trouble with word finding. He also has weakness on the right upper and lower extremity. He denies any new weakness. PSYCHIATRIC: No complaints. All other systems are negative.   PHYSICAL EXAMINATION:  VITAL SIGNS: T-max 99.5, T-current 98.6, pulse 96, blood pressure 128/71, 94% on room air.   GENERAL: A 81 year old white man in no acute distress.   HEENT: Normocephalic, atraumatic. Pupils are equal, reactive to light. Extraocular motion intact. Sclerae, conjunctivae, and lids are without evidence for emboli or petechiae. Oropharynx shows no erythema or exudate. Teeth and gums are in fair condition.   NECK: Supple. Full range of motion. Midline trachea. No lymphadenopathy. No thyromegaly.   CHEST: Clear to auscultation bilaterally. Good air movement. No focal consolidation.   HEART: Regular rate and rhythm without murmur, rub, or gallop.   ABDOMEN: Soft, nontender, and nondistended. No hepatosplenomegaly. No hernia is noted.   EXTREMITIES: No evidence for tenosynovitis.   SKIN: No obvious rashes. No stigmata of endocarditis, specifically no Janeway lesions nor Osler nodes. A sacral pressure ulcer was not directly observed today.   MUSCULOSKELETAL:  No evidence for tenosynovitis.   NEUROLOGIC: The patient had weakness on the right side in the upper and lower extremity. He had some difficulty providing a  history as he would stumble into words and be unable to recall the word and then get sidetracked, but at other times he was able to answer straightforward questions.   PSYCHIATRIC: Mood and affect appeared normal.  LABORATORY, DIAGNOSTIC AND RADIOLOGICAL DATA: BUN 26, creatinine 1.65, bicarbonate 23, anion gap of 8.0. LFTs are unremarkable. White count of 7.0, with a hemoglobin of 7.4, platelet count of 201, ANC of 5.0, ALC of 0.8. White count on admission was 5.7. Blood cultures from admission show no growth. A urinalysis was unremarkable. Urine culture is pending. A chest x-ray showed mild atelectasis in the lung bases.   IMPRESSION: A 81 year old white man with a history of T-cell lymphoma with brain metastases, admitted with fever.   RECOMMENDATIONS:  1. He has not received any chemotherapy recently other than methotrexate. His white count is okay although functionality may be questionable given his underlying lymphoma. He has no focal symptoms, and he has been afebrile in-house. He has no erythema or tenderness around his port. 2. Blood cultures are negative so far.  3. Would continue levofloxacin and vancomycin. If his blood cultures remain negative, would stop the vancomycin tomorrow.  4. If he continues to be afebrile without any other focal symptoms, would give him a total of 10 days of oral levofloxacin.  5. If he spikes again, would consider CT scan of the abdomen and pelvis and repeat a PA and lateral chest x-ray.   This is a moderately complex Infectious Disease case.   Thank you very much for involving me in Mr. Meharg's care.   ____________________________ Heinz Knuckles. Jshaun Abernathy, MD meb:cbb D: 07/08/2012 16:49:30 ET T: 07/08/2012 17:44:13 ET JOB#: [id number]  cc: Heinz Knuckles. Rida Loudin, MD, <Dictator> Khyler Urda E Petula Rotolo MD ELECTRONICALLY SIGNED 07/12/2012 15:23

## 2015-02-26 NOTE — H&P (Signed)
PATIENT NAME:  Aaron Rose, Aaron Rose MR#:  [id number] DATE OF BIRTH:  May 07, 1933  DATE OF ADMISSION:  07/07/2012  PRIMARY CARE PHYSICIAN: Dr. Ouida Sills   CHIEF COMPLAINT: Recurrent fevers.   HISTORY OF PRESENT ILLNESS: The patient is a 81 year old male discovered to have T-cell lymphoma, status post multiple cycles of CHOP therapy, subsequently found to have continued. T-cell lymphoma with CNS metastasis, manifesting with right hemiparesis. He is status post brain radiation as well as methotrexate, last chemotherapy approximately two weeks ago. He had been transferred to a local skilled rehabilitation facility to recover strength, also with stage II decubitus at that time. He had been making slow progress until about 3 to 4 days ago when he began having fevers up to approximately 101 to 102 degrees. He denied any headache, sinus symptoms, cough, shortness of breath, rash, diarrhea, or really any other symptoms along with this. His fevers continued and his labs had been checked with white counts normal. Today he had fever up to 102 as well as tachycardia and was brought to the Emergency Room. He was given Levaquin, cardiac enzymes were checked which revealed a troponin minimally elevated 0.13. He is admitted now with recurrent fevers, mild elevation of troponin, which is most likely demand ischemia in this patient who initially came in with a heart rate of 120 and sinus tach.   PAST MEDICAL HISTORY:  1. History of T-cell lymphoma, angioimmunoblastic, status post CHOP chemotherapy, radiation therapy, methotrexate.  2. 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  3. Hypertension.  4. Gastroesophageal reflux disease.  5. Hyperlipidemia.  6. History of decubitus ulcer, stage II, present on admission.  7. Pancytopenia related to his chemotherapy.  8. Status post vasectomy.  9. Status post tonsillectomy.  10. Status post Port-A-Cath placement.   ALLERGIES: No known drug allergies.    MEDICATIONS:  1. Prilosec 20 mg p.o. daily.  2. Compazine 10 mg p.o. q.6 hours p.r.n. nausea.   SOCIAL HISTORY: No alcohol or tobacco. He is a Animal nutritionist.   FAMILY HISTORY: Depression and valvular heart disease.   REVIEW OF SYSTEMS: Please see history of present illness. No vision changes. No dysphagia. He did not have any aspiration concerns when previously evaluated by speech therapy, per the family, which includes a physician. No cough. No dysphagia or pain with swallowing. No bowel or bladder changes. No rash. He does have a dog who has visited him in the facility. He has not been outside for any particular length of time since he's been to the facility. Remainder of complete review of systems is negative.    PHYSICAL EXAMINATION:   VITAL SIGNS: Temperature 101.9, pulse 118, blood pressure 134/86, weight 73 kg.   GENERAL: Pleasant male with some mild temporal wasting in no distress.   EYES: Pupils round and reactive to light. Lids and conjunctivae unremarkable.   EARS, NOSE, AND THROAT: The oropharynx is moist without evidence of thrush or other lesions. TMs are clear.   NECK: Supple. Trachea midline. No thyromegaly.   CARDIOVASCULAR: Tachycardic without gallops or rubs. Carotid and radial pulses 2+. Distal pulses 1+.   LUNGS: Clear bilaterally. No wheeze or retractions.   ABDOMEN: Soft and somewhat decreased bowel sounds. No guard, rebound, mass.   SKIN: Port-A-Cath right chest wall without edema, erythema, induration. Skin tear right arm, appears to be healing. Stage II decubitus, difficult to examine in the Emergency Room.   LYMPH NODES: No cervical or supraclavicular or axillary nodes are detected.   EXTREMITIES: No  clubbing, cyanosis, or significant edema.   NEUROLOGIC: Right hemiparesis present. The patient is alert, answers questions appropriately. Speech is clear. Some mild receptive aphasia is reported, but not readily detected.   IMPRESSION AND PLAN:   1. Recurrent fevers in patient with history of T-cell lymphoma. Levaquin given in the Emergency Room. Add vancomycin given presence of Port-A-Cath. Oncology consultation. He will need better evaluation of his decubitus when we can do so and will need to consider ID consultation. Consider whole body imaging, currently his creatinine is elevated enough that we need to see if he responds to hydration before we consider this. Discussed briefly with Hematology and they are comfortable with this plan at this point. Will send sed rate as an additional marker.  2. Tachycardia. This appears to be likely response to fever, likely systemic inflammatory response syndrome. His mild troponin elevation is most consistent with demand ischemia but will continue to follow this to rule out cardiac event which certainly wouldn't explain his fevers unless he had some sort of intracardiac infection. I heard no murmurs on examination. Will get echocardiogram to further investigate this. Add Lopressor. Add aspirin as his platelet count is normal. Await his follow-up troponins as well as monitoring CKs.  3. Dyspepsia. Will change to pantoprazole.   The above plan of action was discussed with the patient and his family members and they are comfortable with this plan.   ____________________________ Adin Hector, MD bjk:drc D: 07/07/2012 21:09:52 ET T: 07/08/2012 04:54:19 ET JOB#: [id number]  cc: Adin Hector, MD, <Dictator> Ocie Cornfield. Ouida Sills, MD Ramonita Lab MD ELECTRONICALLY SIGNED 07/13/2012 17:44

## 2015-02-26 NOTE — Consult Note (Signed)
PATIENT NAME:  Aaron Rose, Aaron Rose MR#:  [id number] DATE OF BIRTH:  01-Aug-1933  DATE OF CONSULTATION:  08/11/2012  REFERRING PHYSICIAN:  Dr. Laurin Coder CONSULTING PHYSICIAN:  Algernon Huxley, MD  REASON FOR CONSULTATION: PE and deep vein thrombosis in a patient who is a poor candidate for long-term anticoagulation.   HISTORY OF PRESENT ILLNESS: This is a 81 year old white male with chest pain who was admitted with severe and significant pulmonary embolus. In addition to that he was found to have extensive bilateral lower extremity deep venous thrombosis up to and including the common femoral vein on the right and up to the common femoral vein on the left. 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 For these reasons, an IVC filter is requested.   REVIEW OF SYSTEMS: Positive for recent fever, positive for fatigue. EYES: No blurred or double vision. EARS: No tinnitus or ear pain. CARDIOVASCULAR: No palpitations. Positive for chest pain. RESPIRATORY: Positive for shortness of breath. GASTROINTESTINAL: No nausea, vomiting, or diarrhea. GENITOURINARY: No dysuria or hematuria. ENDOCRINE: No heat or cold intolerance. HEME: Positive for lymphoma and chronic anemia, which is significant. SKIN: No new rash or ulcers. NEUROLOGICAL: Positive for right-sided weakness. PSYCH: No anxiety or depression.   PAST MEDICAL HISTORY:    1. 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  2. Right hemiparesis. 3. Hypertension.  4. Hyperlipidemia.  5. Pancytopenia.  6. Anemia.   ALLERGIES: None known.   MEDICATIONS:  1. Baclofen 10 mg every six hours as needed.  2. Bisacodyl suppositories as needed.  3. Chlorpromazine 25 mg every six hours as needed.  4. Colace 100 mg b.i.d. 5. Ketoconazole cream as  needed.  6. Loperamide as needed.  7. Mag-Ox 400 mg daily.  8. Metoprolol 25 mg b.i.d.  9. Ondansetron as needed.  10. Protonix 40 mg b.i.d.  11. Potassium chloride 10 mEq daily.  12. Tylenol as needed.   SOCIAL HISTORY: Recently relocated here to be close to his family after his diagnosis. No alcohol or drug abuse and no tobacco.   FAMILY HISTORY: No coronary artery disease or cancer.   PAST SURGICAL HISTORY:   1. Vasectomy. 2. Tonsillectomy. 3. Port.   PHYSICAL EXAMINATION:  GENERAL: This is a well-developed, well-nourished white male. He is not in acute distress.   VITAL SIGNS: Temperature 98.1, pulse 100, blood pressure 128/80, saturations 94%.   HEENT: Normocephalic, atraumatic.   EYES: Sclerae anicteric. Conjunctivae are clear.   EARS: Normal external appearance. Hearing is intact.   NECK: Supple without adenopathy or jugular venous distention.   HEART: Slightly tachycardic but regular.   LUNGS: Diminished bilaterally.   ABDOMEN: Soft, nondistended and nontender.   EXTREMITIES: Some lower extremity edema. Good capillary refill.   NEUROLOGIC: Right-sided weakness.   SKIN: Warm and dry.   LABORATORY, DIAGNOSTIC, AND RADIOLOGICAL DATA: Sodium 141, potassium 4.4, chloride 108, CO2 23, BUN 18, creatinine 1.28. Glucose 92, white blood cell count 5.3, hemoglobin 9.7, platelet count 211,000.   ASSESSMENT AND PLAN: This is a 81 year old white male with advanced malignancy and brain mets. He has extensive deep vein thrombosis and PE and is not going to be a great candidate for long-term anticoagulation, obviously is a high risk of further thromboembolic problems. An IVC filter is very reasonable. He desires to proceed.  This will be placed today. This is a level-4 consultation.   ____________________________ Algernon Huxley, MD jsd:ap D: 08/11/2012 12:41:52 ET T: 08/11/2012 12:50:43 ET JOB#: [id number]  cc: Algernon Huxley, MD, <Dictator> Algernon Huxley MD ELECTRONICALLY SIGNED  08/15/2012 11:18

## 2015-02-26 NOTE — Discharge Summary (Signed)
PATIENT NAME:  Aaron, Rose MR#:  [id number] DATE OF BIRTH:  21-Apr-1933  DATE OF ADMISSION:  10/07/2012 DATE OF DISCHARGE:  10/09/2012  DISCHARGE DIAGNOSES:  1. Pneumonia most likely secondary to aspiration.  2. Systemic inflammatory response syndrome with fever and tachycardia secondary to above. Blood cultures are growing coagulase-negative staph aureus.  3. History of T cell lymphoma with brain metastasis.  4. Right-sided weakness following previous whole body irradiation.  5. Hypotension.   CHIEF COMPLAINT: Coughing and vomiting while eating.   HISTORY OF PRESENT ILLNESS: Aaron Rose is a 81 year old male with a history of T-cell lymphoma with brain metastasis who had undergone whole brain irradiation therapy and subsequently it was noted right-sided weakness with slurred speech. He also has a history of hypertension, gastroesophageal reflux disease. He was in the assisted living facility at the [facility] when he started having an episode of coughing associated with vomiting and subsequently developed hypoxemia, fever of 101.5, and was tachycardic. The patient received a dose of clindamycin but continued to be dyspneic and hypoxemic and was subsequently sent to the ER for possible aspiration pneumonia.   PAST MEDICAL HISTORY: Significant for: 1. T-cell lymphoma, finished chemotherapy.  2. History of brain metastasis and T-cell lymphoma, status post total brain irradiation.  3. Right-sided paralysis secondary to brain metastasis.  4. Hypertension.  5. Hyperlipidemia.  6. Gastroesophageal reflux disease.  7. Pancytopenia secondary to chemotherapy.  8. Constipation.  9. Actinic keratosis.  10. History of pulmonary embolism, on Xarelto.    PAST SURGICAL HISTORY: Significant for: 1. IVC filter placement.  2. Ear surgery. 3. Port-A-Cath placement. 4. Tonsillectomy.  5. Vasectomy.   PHYSICAL EXAMINATION:  GENERAL: He was not in acute distress.   HEENT: Normocephalic,  atraumatic. Oropharynx was clear.   NECK: Supple.   LUNGS: Clear to auscultation.   HEART: S1, S2, rapid but regular rhythm.   ABDOMEN: Soft, nontender.   EXTREMITIES: No edema. Evidence of right-sided weakness noted.   LABORATORY, DIAGNOSTIC AND RADIOLOGICAL DATA: WBC count 3.4, hemoglobin 11.9, hematocrit 35, platelets 328, sodium 137, potassium 4.3, bicarbonate 23, BUN 25, creatinine 1.3, glucose 120, calcium 9.7, ALT 39, AST 33. Urinalysis was negative for infection. CT of the chest did not show evidence of PE. There are infiltrates seen within the lung bases as well as dependent portion of the right and left upper lobes.   HOSPITAL COURSE: The patient underwent a swallowing study when he was in the hospital that was normal. Blood cultures grew coagulase-negative Staphylococcus aerobic in aerobic and anaerobic cultures.  ID is still follow. The patient was treated in the hospital with intravenous Zosyn. During his stay in the hospital he did well and did not have any episodes of fever. His heart rate also settled down, and overall the patient appeared to be close to his baseline self. He was discharged in stable condition on the following medications.   DISCHARGE MEDICATIONS:  1. Levaquin 5 mg p.o. daily for one week.  2. Norco 5/325, one tablet every four hours p.r.n.  3. Carvedilol 3.125 mg b.i.d.  4. Baclofen 10 mg t.i.d. p.r.n.  5. Xarelto 20 mg once a day.  6. Pantoprazole 40 mg b.i.d.  7. Mag-Ox 400 mg once a day.  8. Docusate sodium 100 mg b.i.d. as needed for constipation.  9. Loperamide 2 mg p.r.n. for diarrhea. 10. Chlorpromazine 25 mg q.i.d. p.r.n.  11. Levaquin 5 mg p.o. daily for one more week.   FOLLOWUP: The patient has been advised  to follow up with Dr. Ouida Sills in one to two weeks' time.   Total time for discahrge and coordination of care: 35 minutes  ____________________________ Tracie Harrier, MD vh:cbb D: 10/09/2012 10:20:46 ET T: 10/09/2012 13:48:16  ET JOB#: [id number]  cc: Tracie Harrier, MD, <Dictator> Tracie Harrier MD ELECTRONICALLY SIGNED 10/12/2012 9:09

## 2015-02-26 NOTE — H&P (Signed)
PATIENT NAME:  Aaron Rose, Aaron Rose MR#:  [id number] DATE OF BIRTH:  1933/06/07  DATE OF ADMISSION:  10/07/2012  ADMITTING PHYSICIAN: Gladstone Lighter, MD   PRIMARY CARE PHYSICIAN: Frazier Richards, MD   CHIEF COMPLAINT: Coughing and vomiting while eating.   HISTORY OF PRESENT ILLNESS: Mr. Ortez is a 81 year old Caucasian male with past medical history significant for T-cell lymphoma with brain metastasis, finished chemotherapy and also whole brain radiation which resulted in right-sided paralysis and also slurred speech, hypertension, gastroesophageal reflux disease, was sent in from [facility] skilled nursing facility secondary to an episode of coughing while eating breakfast and then vomiting after that resulting in tachypnea and hypoxia, fever of 101.5 degrees Fahrenheit. The patient is still tachycardic, heart rate of 120s. He had no recorded fever here in the ED. He did receive a dose of clindamycin, still easily dyspneic and hypoxic on movement, and blood pressure is gradually dropping so he is being admitted for possible aspiration pneumonia.   PAST MEDICAL HISTORY:  1. T-cell lymphoma, finished chemotherapy.  2. Brain metastasis from T-cell lymphoma, status post total brain irradiation.  3. Right-sided paralysis secondary to his brain metastasis.  4. Hypertension.  5. Hyperlipemia.  6. Gastroesophageal reflux disease.  7. Pancytopenia related to chemotherapy. 8. Constipation.  9. Actinic keratosis.  10. History of pulmonary embolism, on Xarelto.  PAST SURGICAL HISTORY:  1. Inferior vena cava filter placement.  2. Ear surgery.  3. Port-A-Cath placement.  4. Tonsillectomy.  5. Vasectomy.   ALLERGIES: No known drug allergies.   HOME MEDICATIONS:  1. Baclofen 10 mg p.o. t.i.d. p.r.n. for hiccups.  2. Carvedilol 3.125 mg p.o. b.i.d.   3. Chlorpromazine 25 mg p.o. q.i.d. as needed for hiccups.  4. Colace 100 mg p.o. b.i.d. for constipation.  5. Guaifenesin cough syrup  10 mL every four hours p.r.n. for cough.  6. Loperamide 2 mg, 2 tablets p.r.n. for diarrhea. 7. Magnesium oxide 400 mg  p.o. daily.  8. Norco, 5/325 mg, one tablet every four hours p.r.n. for pain.  9. Protonix 40 mg p.o. b.i.d.  10. Promethazine suppository every four hours as needed for nausea and vomiting.  11. Promethazine 25 mg orally every four hours p.r.n. for nausea and vomiting.  12. Tylenol 1000 mg  every four hours as needed.  13. Xarelto 20 mg p.o. daily.  14. Zofran 4 mg, one to two tablets every four hours p.r.n. for nausea and vomiting.   SOCIAL HISTORY: Currently a resident of [facility] skilled nursing facility.  No smoking. No alcohol use.   FAMILY HISTORY: Mother died at age 59 from heart disease, and father at  36, also had mental health issues.   REVIEW OF SYSTEMS:  CONSTITUTIONAL: No fever, fatigue or weakness. EYES: No blurred vision, positive for left eye visual field disturbances. No glaucoma or cataracts. The visual field disturbances started after the brain metastasis. ENT: No decreased hearing. No runny nose, postnasal drip, epistaxis or discharge. No sore throat. CARDIOVASCULAR: No chest pain, orthopnea, edema, arrhythmia, palpitations, or syncope. RESPIRATORY: Positive for cough. No chronic obstructive pulmonary disease or hemoptysis. GI: Positive for nausea and one episode of vomiting this morning after aspiration. No abdominal pain, hematemesis, or melena. GENITOURINARY: No dysuria, hematuria, calculus, frequency or incontinence. ENDOCRINE: No polyuria, nocturia, thyroid problems, heat or cold intolerance. HEMATOLOGY: No anemia, easy bruising or bleeding. SKIN: No acne, rash, or lesions. MUSCULOSKELETAL: No neck, back, or shoulder pain. Positive for spasms. NEUROLOGICAL: Weak on the right side and  also cranial nerve palsy secondary to brain metastasis. PSYCHOLOGICAL: No anxiety, insomnia, or depression.  PHYSICAL EXAMINATION:  VITAL SIGNS: Temperature 97.8  degrees Fahrenheit, pulse 118, respirations 18, blood pressure 143/86, pulse oximetry 98% on room air.   GENERAL: Well built, well nourished male lying in bed, not in any acute distress.   HEENT: Normocephalic, atraumatic. Left lower motor neural and cranial nerve palsy is present with left facial droop. Pupil on the right side is equal, round, reacting to light. Anicteric sclerae. Extraocular movements are intact.  Oropharynx clear with no erythema, mass or exudates.   NECK: Supple. No thyromegaly, JVD, carotid bruits. No lymphadenopathy.   LUNGS: Clear to auscultation. No wheeze or crackles. No use of accessory muscles for breathing. Coarse rhonchi at both bases posteriorly.   CARDIOVASCULAR: S1 and S2. Rapid rate and regular rhythm. No murmurs, rubs, or gallops.   ABDOMEN: Soft, nontender, nondistended. No hepatosplenomegaly. Normal bowel sounds.   EXTREMITIES: No pedal edema. No clubbing or sinuses, 2+ dorsalis pedis pulses palpable bilaterally.   SKIN: No acne, rash, or lesions.   LYMPHATICS: No cervical lymphadenopathy.   NEUROLOGIC: As mentioned above, left-sided lower motor neuron facial palsy. He has 2 out of 5 strength in right upper and lower extremities and 5 out of 5 in left upper and lower extremities. Reflexes: Knee jerks are 1+ on both sides.   PSYCHOLOGICAL: The patient is awake, alert, oriented x3.   LABORATORY, DIAGNOSTIC AND RADIOLOGICAL DATA: WBC 3.4, hemoglobin 11.9, hematocrit 35.0, platelet count 328. Sodium 137, potassium 4.3, chloride, bicarbonate 23, BUN 25, creatinine 1.3, glucose 120, and calcium of 9.7. ALT 39, AST 33, alkaline phosphatase 115, total bilirubin 0.3, albumin 3.1. INR 1.0. Troponin less than 0.02. Urinalysis negative for any infection. Chest x-ray showing right lung base atelectasis, infiltrate is not excluded. No pneumothorax is present. CT of the chest showing no evidence of PE. Infiltrate seen within the lung bases as well as dependent portions  of the right and left upper lobes. Hypoventilation also noted.   ASSESSMENT AND PLAN: A 81 year old male with history of T-cell lymphoma, brain metastasis, status post chemotherapy and also whole brain irradiation resulting in right-sided paralysis, brought in from skilled nursing facility secondary to start of choking and coughing during breakfast this morning. He appears to be hypoxic, tachycardic and borderline low normal blood pressure, so he is being admitted.   1. Aspiration pneumonia: No prior history of aspiration, according to the family. He has been on a regular diet with thin liquids. Probably coincidentally a coughing spell that caused the choking episode and aspiration today. We will keep him n.p.o., get a swallow evaluation. Chest x-ray is showing significant infiltrates in the dependent portions, and his vitals are unstable, so we will admit and start fluids and Zosyn and continue to monitor labs.  2. Systemic inflammatory response syndrome with fever, tachycardia secondary to aspiration pneumonia.  3. T cell lymphoma and brain metastasis: He follows at [facility]. A recent PET scan was negative 3 weeks ago per family.  4. Pancytopenia from recent chemotherapy: At baseline.  5. Hypertension: Continue home medication.  6. GI and deep venous thrombosis prophylaxis: On Protonix and Xarelto due to his recent history of pulmonary embolism.   CODE STATUS:  FULL CODE.     TIME SPENT AT ADMISSION: 50 minutes.    ____________________________ Gladstone Lighter, MD rk:cbb D: 10/07/2012 18:17:24 ET T: 10/08/2012 08:04:07 ET JOB#: [id number]  cc: Gladstone Lighter, MD, <Dictator> Ocie Cornfield. Ouida Sills, MD Gladstone Lighter MD  ELECTRONICALLY SIGNED 10/16/2012 13:50

## 2015-02-26 NOTE — Consult Note (Signed)
Brief Consult Note: Diagnosis: FEVER, LYMPHOMA, CNS PARENCY,MAL DISEASE, AZOTENIA, ANEMIA HYPOKALEMIA.   Patient was seen by consultant.   Recommend further assessment or treatment.   Orders entered.   Comments: SEE DICTATED NOTE TO FOLLOW  PATIENT STABLE, FEVER DOWN, CULTURES PENDING FEVER NO OBVIOUS SOURCE, DOUBT TUMOR FEVER SINCE APPEARS TO BE RESPONDING TO ANTIBIOTICS/ OR TIME, LIKLEY BACTERIAL OR VIRAL, . DOUBT EFFECT OF RECENT CHEMOTX.  HX OF CHOP TX IN THE PAST , THEN STEREOTACTIC XRT X 2 IN [place], THEN MOVED LOCALLY,  ESTABLISHED AT [facility]. S/P HIGH DOSE INTRAVENOUS METHOTRXATE 1 WEEK AGO, NEXT TX DUE 1 WEEK.  ABNORNMAL FINDINGS NOW HIGH ESR, LOW MG HAS BEEN TX, ANEMIA, AZOTEMIA.   SUGGEST. STOOL SOFTENERS. SERIAL CBC. IF SYMPTOMATIC OR IF BLEEDING OR IF HGB BELOW 7G THEN TRANSFUSE IRRADIATED PRBC, BLOOD MUST BE RADIATED.Marland Kitchen CHECK STOOL GUIACS. CHECK SIEP AND LDH. NO  NEED FOR CT OR OTHER IMAGING AT THIS TIME AND WOULD NOT GIVE ANY DYE.  Electronic Signatures: Dallas Schimke (MD)  (Signed 30-Aug-13 14:08)  Authored: Brief Consult Note   Last Updated: 30-Aug-13 14:08 by Dallas Schimke (MD)

## 2015-03-02 NOTE — H&P (Signed)
PATIENT NAME:  Aaron Rose, ENCARNACION MR#:  [id number] DATE OF BIRTH:  [birth year]  DATE OF ADMISSION:  01/26/2014   PRIMARY CARE PHYSICIAN: Dr. Frazier Richards   REFERRING PHYSICIAN: Dr. Gwenyth Bouillon  CHIEF COMPLAINT: Cough, sputum, fever and chills.   HISTORY OF PRESENT ILLNESS: A 81 year old Caucasian male with a history of T-cell lymphoma with brain metastases, hypertension, and hyperlipidemia, was sent to ED from home  due to cough, fever and chills for one days. The patient is awake but confused, unable to provide information. According to the patient's wife and daughter, the patient has had a cough, fever and sputum for one day. In addition, the patient has generalized weakness. The patient has a past medical history of T-cell lymphoma status post chemotherapy. In addition, the patient has had a recent diagnosis of melanoma skin cancer which were removed. The patient is on [place]. The patient's temperature is 102 in the ED, chest x-ray showed right-sided pneumonia. The patient was treated with Levaquin. Blood culture was sent.   PAST MEDICAL HISTORY:  1. T-cell lymphoma with brain metastasis, melanoma. skin cancer, right-sided paralysis secondary to brain metastasis.  2. Hypertension.  3. Hyperlipidemia.  4. GERD.  5. Pancytopenia due to chemotherapy.  6. History of PE on Xarelto. 7. Actinic keratosis.   PAST SURGICAL HISTORY: IVC filter placement surgery,ear surgery, Port-A-Cath placement, tonsillectomy, vasectomy, melanoma removal. Skin squamous cancer removal on the left arm.   SOCIAL HISTORY: No smoking or drinking or illicit drugs. The patient come from home.   FAMILY HISTORY: Mother died of heart disease. Father had a mental health issues.   REVIEW OF SYSTEMS: Unable to obtain due to the patient's mental status.   ALLERGIES: SHELLFISH.   HOME MEDICATIONS: Xarelto 20 mg in the evening, Tylenol 1000 mg every four hours p.r.n., pravastatin 20 mg p.o. at bedtime. Zofran 4 mg 1 to 2  tabs every four hours p.r.n.,  Omeprazole 20 mg p.o. daily, Colace 100 mg p.o. p.r.n., Coreg 3.125 mg b.i.d. Gleevec 100 mg 2 caps b.i.d.   PHYSICAL EXAMINATION: VITAL SIGNS: Temperature 102.4, blood pressure 114/61, pulse 83, respirations 18, oxygen saturation 95% on oxygen.  GENERAL: The patient is weak, confused, not follow commands.  HEENT: Pupils round, equal and reactive to light. Moist oral mucosa. No discharge from ear or nose.  NECK: Supple. No JVD or carotid bruit. No lymphadenopathy. No thyromegaly.  CARDIOVASCULAR: S1, S2 regular rate, rhythm. No murmurs or gallops.  PULMONARY: Bilateral air entry, bilateral basilar crackles. No use of accessory muscle to breathe.  ABDOMEN: Soft, obese. No distention or tenderness. No organomegaly. Bowel sounds present. EXTREMITIES: No edema, clubbing or cyanosis. No calf tenderness. Pedal pulses present. SKIN: No rash or jaundice.  NEUROLOGIC: The patient is confused, unable to examine at this time.   LABORATORY, DIAGNOSTIC AND RADIOLOGICAL DATA: Influenza A and B negative. Glucose 139, BUN 17, creatinine 1.49. Electrolytes: Sodium 136, potassium 4.4, chloride 108, bicarbonate 22. Magnesium 1.5, phosphorus 1.3. Chest x-ray shows infrahilar atelectasis or developing pneumonia on the right. WBC 2.7, hemoglobin 10.9, platelets 119, INR 1.9. Lactic acid 2.4. EKG showed normal sinus rhythm at 98 BPM.   IMPRESSIONS:  1.  Sepsis.  2. Pneumonia.  3. Acute metabolic encephalopathy.  4. Lactic acidosis.  5. Acute renal failure.  6. Hypomagnesemia.  7. Hypophosphatemia.  8. Pancytopenia. 9. T-cell lymphoma with brain metastasis.  10. History of pulmonary embolism on Xarelto. 11. Hypertension.   PLAN OF TREATMENT: 1. The patient will be admitted to the medical  floor. We will continue Levaquin but hold [place]. Follow up CBC, blood culture, and sputum culture.  2. For acute renal failure we will start on normal saline IV. Follow up BMP. Hold Coreg due  to low sided blood pressure.  3. For hypomagnesemia and hypophosphatemia. We will give him supplement and follow up level.  4. For PE, we will continue Xarelto.   5. I discussed the patient's condition and plan of treatment with the  the patient's wife and daughter.  The patient's wife said she wants the patient was placed on FULL CODE.   TIME SPENT: About 63 minutes.     ____________________________ Demetrios Loll, MD qc:sg D: 01/26/2014 13:07:00 ET T: 01/26/2014 13:33:49 ET JOB#: [id number]  cc: Demetrios Loll, MD, <Dictator> Demetrios Loll MD ELECTRONICALLY SIGNED 01/27/2014 17:33

## 2015-03-02 NOTE — Discharge Summary (Signed)
PATIENT NAME:  Aaron Rose, Aaron Rose MR#:  [id number] DATE OF BIRTH:  16-Jan-1933  DATE OF ADMISSION:  01/26/2014 DATE OF DISCHARGE:  01/29/2014  DISCHARGE DIAGNOSES: 1.  Pneumonia. 2.  Shortness of breath and wheezing.  3.  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 4.  Hemiparesis from metastatic lymphoma, functionally quadriplegic with his pneumonia in the hospital.   DISCHARGE MEDICATIONS: Per [facility] med reconciliation. He will be on Levaquin and prednisone taper. I will hold his Gleevec, which is his chemotherapy, for a week.   HISTORY AND PHYSICAL: Please see detailed admission history and physical done on admission   HOSPITAL COURSE: The patient was admitted short of breath with pneumonia on a chest x-ray. Treated with multiple antibiotics given his recent chemotherapy and stomach surgery, etc. He responded well to that and his shortness of breath and perceived wheezing improved markedly. Steroids were added IV, seemed to help as well. He was anxious, wishing to go home. However, he was still weak and had been transferring on his own at home with help of his elderly wife. It was thought it would be best for him to go to a skilled facility where he resides for further treatment and antibiotics and prednisone, etc., until he is ready to go home. His white blood cell count was 2,200 today with approximately 87% neutrophils. Hemoglobin was 9.9. He had mild hyperglycemia from the steroids. Otherwise, his MET-B was okay. He did have mid lower lobe lung field infiltrates present thought secondary to pneumonia given the clinical circumstances as noted. Sputum cultures at this point were negative from the 20th, although it is still being held at this point. Given his marked improvement, planning on giving Levaquin alone, although given that he is a skilled facility could start IV meds again if in fact that is necessary. Blood cultures are negative  to date. Discussed this with the patient's daughter who is pediatrician. She agrees with this plan of treatment at this point. ____________________________ Ocie Cornfield. Ouida Sills, MD mwa:sb D: 01/29/2014 08:10:11 ET T: 01/29/2014 08:50:45 ET JOB#: [id number]  cc: Ocie Cornfield. Ouida Sills, MD, <Dictator> Kirk Ruths MD ELECTRONICALLY SIGNED 01/30/2014 7:24

## 2015-09-19 ENCOUNTER — Encounter: Payer: Self-pay | Admitting: Anesthesiology

## 2015-09-19 ENCOUNTER — Ambulatory Visit
Admission: RE | Admit: 2015-09-19 | Discharge: 2015-09-19 | Disposition: A | Discharge status: Home / Self Care | Payer: Medicare Other | Source: Ambulatory Visit | Attending: Unknown Physician Specialty | Admitting: Unknown Physician Specialty

## 2015-09-19 ENCOUNTER — Ambulatory Visit: Payer: Medicare Other | Admitting: Anesthesiology

## 2015-09-19 ENCOUNTER — Encounter
Admission: RE | Disposition: A | Discharge status: Home / Self Care | Payer: Self-pay | Source: Ambulatory Visit | Attending: Unknown Physician Specialty

## 2015-09-19 DIAGNOSIS — Z86010 Personal history of colonic polyps: Secondary | ICD-10-CM | POA: Insufficient documentation

## 2015-09-19 DIAGNOSIS — G8191 Hemiplegia, unspecified affecting right dominant side: Secondary | ICD-10-CM | POA: Diagnosis not present

## 2015-09-19 DIAGNOSIS — K625 Hemorrhage of anus and rectum: Secondary | ICD-10-CM | POA: Diagnosis not present

## 2015-09-19 DIAGNOSIS — D123 Benign neoplasm of transverse colon: Secondary | ICD-10-CM | POA: Insufficient documentation

## 2015-09-19 DIAGNOSIS — D120 Benign neoplasm of cecum: Secondary | ICD-10-CM | POA: Diagnosis not present

## 2015-09-19 DIAGNOSIS — R197 Diarrhea, unspecified: Secondary | ICD-10-CM | POA: Insufficient documentation

## 2015-09-19 DIAGNOSIS — K640 First degree hemorrhoids: Secondary | ICD-10-CM | POA: Diagnosis not present

## 2015-09-19 DIAGNOSIS — Z79899 Other long term (current) drug therapy: Secondary | ICD-10-CM | POA: Diagnosis not present

## 2015-09-19 DIAGNOSIS — Z85841 Personal history of malignant neoplasm of brain: Secondary | ICD-10-CM | POA: Diagnosis not present

## 2015-09-19 DIAGNOSIS — I10 Essential (primary) hypertension: Secondary | ICD-10-CM | POA: Insufficient documentation

## 2015-09-19 DIAGNOSIS — K5730 Diverticulosis of large intestine without perforation or abscess without bleeding: Secondary | ICD-10-CM | POA: Diagnosis not present

## 2015-09-19 DIAGNOSIS — Z7901 Long term (current) use of anticoagulants: Secondary | ICD-10-CM | POA: Insufficient documentation

## 2015-09-19 DIAGNOSIS — F17210 Nicotine dependence, cigarettes, uncomplicated: Secondary | ICD-10-CM | POA: Insufficient documentation

## 2015-09-19 HISTORY — DX: Malignant neoplasm of brain, unspecified: C71.9

## 2015-09-19 HISTORY — DX: Pneumonia, unspecified organism: J18.9

## 2015-09-19 HISTORY — PX: COLONOSCOPY WITH PROPOFOL: SHX5780

## 2015-09-19 HISTORY — DX: Malignant (primary) neoplasm, unspecified: C80.1

## 2015-09-19 HISTORY — DX: Other pulmonary embolism without acute cor pulmonale: I26.99

## 2015-09-19 SURGERY — COLONOSCOPY WITH PROPOFOL
Anesthesia: General

## 2015-09-19 MED ORDER — SODIUM CHLORIDE 0.9 % IV SOLN: INTRAVENOUS | Status: DC

## 2015-09-19 MED ORDER — PROPOFOL 500 MG/50ML IV EMUL
INTRAVENOUS | Status: DC | PRN
  Administered 2015-09-19: 100 ug/kg/min via INTRAVENOUS

## 2015-09-19 MED ORDER — EPHEDRINE SULFATE 50 MG/ML IJ SOLN
INTRAMUSCULAR | Status: DC | PRN
  Administered 2015-09-19: 10 mg via INTRAVENOUS

## 2015-09-19 MED ORDER — SODIUM CHLORIDE 0.9 % IV SOLN
INTRAVENOUS | Status: DC
  Administered 2015-09-19: 09:00:00 via INTRAVENOUS

## 2015-09-19 MED ORDER — PIPERACILLIN-TAZOBACTAM 3.375 G IVPB 30 MIN
3.3750 g | Freq: Once | INTRAVENOUS | Status: AC
  Administered 2015-09-19: 3.375 g via INTRAVENOUS
  Filled 2015-09-19: qty 50

## 2015-09-19 NOTE — Op Note (Signed)
[facility] Gastroenterology Patient Name: Aaron Rose Procedure Date: 09/19/2015 9:27 AM MRN: [id number] Account #: [id number] Date of Birth: 18-Sep-1933 Admit Type: Outpatient Age: 81 Room: [facility] ENDO ROOM 1 Gender: Male Note Status: Finalized Procedure:         Colonoscopy Indications:       High risk colon cancer surveillance: Personal history of                     colonic polyps Providers:         Manya Silvas, MD Medicines:         Propofol per Anesthesia Complications:     No immediate complications. Procedure:         Pre-Anesthesia Assessment:                    - After reviewing the risks and benefits, the patient was                     deemed in satisfactory condition to undergo the procedure.                    After obtaining informed consent, the colonoscope was                     passed under direct vision. Throughout the procedure, the                     patient's blood pressure, pulse, and oxygen saturations                     were monitored continuously. The Colonoscope was                     introduced through the anus and advanced to the the cecum,                     identified by appendiceal orifice and ileocecal valve. The                     colonoscopy was performed without difficulty. The patient                     tolerated the procedure well. The quality of the bowel                     preparation was good. Findings:      Four sessile polyps were found in the transverse colon and in the cecum.       The polyps were small in size. These polyps were removed with a cold       snare. Resection and retrieval were complete.      A diminutive polyp was found in the transverse colon. The polyp was       sessile. The polyp was removed with a jumbo cold forceps. Resection and       retrieval were complete. 3 clips applied to one site and 1 clip to       another site.      A few small-mouthed diverticula were found in the transverse  colon.      Internal hemorrhoids were found during endoscopy. The hemorrhoids were       small and Grade I (internal hemorrhoids that do not prolapse). Impression:        - Four small polyps  in the transverse colon and in the                     cecum. Resected and retrieved.                    - One diminutive polyp in the transverse colon. Resected                     and retrieved.                    - Diverticulosis in the transverse colon.                    - Internal hemorrhoids. Recommendation:    - Await pathology results. Manya Silvas, MD 09/19/2015 10:04:42 AM This report has been signed electronically. Number of Addenda: 0 Note Initiated On: 09/19/2015 9:27 AM Scope Withdrawal Time: 0 hours 16 minutes 46 seconds  Total Procedure Duration: 0 hours 29 minutes 38 seconds       [facility]

## 2015-09-19 NOTE — Transfer of Care (Signed)
Immediate Anesthesia Transfer of Care Note  Patient: Aaron Rose  Procedure(s) Performed: Procedure(s): COLONOSCOPY WITH PROPOFOL (N/A)  Patient Location: PACU  Anesthesia Type:General  Level of Consciousness: awake and sedated  Airway & Oxygen Therapy: Patient Spontanous Breathing and Patient connected to nasal cannula oxygen  Post-op Assessment: Report given to RN and Post -op Vital signs reviewed and stable  Post vital signs: Reviewed and stable  Last Vitals:  Filed Vitals:   09/19/15 0843  BP: 134/81  Pulse: 74  Temp: 36.2 C  Resp: 18    Complications: No apparent anesthesia complications

## 2015-09-19 NOTE — H&P (Signed)
   Primary Care Physician:  Kirk Ruths., MD Primary Gastroenterologist:  Dr. Vira Agar  Pre-Procedure History & Physical: HPI:  Aaron Rose is a 81 y.o. male is here for an colonoscopy.   Past Medical History  Diagnosis Date  . 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 Brain cancer ([place])   . Brain cancer [facility])     History reviewed. No pertinent past surgical history.  Prior to Admission medications   Medication Sig Start Date End Date Taking? Authorizing Provider  atorvastatin (LIPITOR) 10 MG tablet Take 10 mg by mouth daily.   Yes Historical Provider, MD  gabapentin (NEURONTIN) 100 MG capsule Take 100 mg by mouth 3 (three) times daily.   Yes Historical Provider, MD  imatinib (GLEEVEC) 100 MG tablet Take 100 mg by mouth daily. Take with meals and large glass of water.Caution:Chemotherapy   Yes Historical Provider, MD  pantoprazole (PROTONIX) 40 MG tablet Take 40 mg by mouth daily.   Yes Historical Provider, MD  rivaroxaban (XARELTO) 20 MG TABS tablet Take 20 mg by mouth daily with supper.   Yes Historical Provider, MD    Allergies as of 08/22/2015  . (Not on File)    History reviewed. No pertinent family history.  Social History   Social History  . Marital Status: Married    Spouse Name: N/A  . Number of Children: N/A  . Years of Education: N/A   Occupational History  . Not on file.   Social History Main Topics  . Smoking status: Not on file  . Smokeless tobacco: Not on file  . Alcohol Use: Not on file  . Drug Use: Not on file  . Sexual Activity: Not on file   Other Topics Concern  . Not on file   Social History Narrative  . No narrative on file    Review of Systems: See HPI, otherwise negative ROS  Physical Exam: BP 134/81 mmHg  Pulse 74  Temp(Src) 97.1 F (36.2 C) (Tympanic)  Resp 18 General:   Alert,  pleasant and cooperative in NAD Head:  Normocephalic and atraumatic. Neck:  Supple; no masses or  thyromegaly. Lungs:  Clear throughout to auscultation.    Heart:  Regular rate and rhythm. Abdomen:  Soft, nontender and nondistended. Normal bowel sounds, without guarding, and without rebound.   Neurologic:  Alert and  oriented x4;  grossly normal neurologically.  Impression/Plan: Aaron Rose is here for an colonoscopy to be performed for rectal bleeding, previous polyps  Risks, benefits, limitations, and alternatives regarding  colonoscopy have been reviewed with the patient.  Questions have been answered.  All parties agreeable.   Gaylyn Cheers, MD  09/19/2015, 8:55 AM

## 2015-09-19 NOTE — Anesthesia Postprocedure Evaluation (Signed)
  Anesthesia Post-op Note  Patient: Aaron Rose  Procedure(s) Performed: Procedure(s): COLONOSCOPY WITH PROPOFOL (N/A)  Anesthesia type:General  Patient location: PACU  Post pain: Pain level controlled  Post assessment: Post-op Vital signs reviewed, Patient's Cardiovascular Status Stable, Respiratory Function Stable, Patent Airway and No signs of Nausea or vomiting  Post vital signs: Reviewed and stable  Last Vitals:  Filed Vitals:   09/19/15 1046  BP: 144/62  Pulse: 57  Temp:   Resp: 14    Level of consciousness: awake, alert  and patient cooperative  Complications: No apparent anesthesia complications

## 2015-09-19 NOTE — Anesthesia Preprocedure Evaluation (Addendum)
Anesthesia Evaluation  Patient identified by MRN, date of birth, ID band Patient awake    Reviewed: Allergy & Precautions, NPO status , Patient's Chart, lab work & pertinent test results  Airway Mallampati: II  TM Distance: >3 FB Neck ROM: Full    Dental  (+) Caps, Chipped   Pulmonary pneumonia, resolved, Current Smoker, PE   Pulmonary exam normal breath sounds clear to auscultation       Cardiovascular Normal cardiovascular exam  HTN in past   Neuro/Psych Brain mets... Right - sided paresis negative psych ROS   GI/Hepatic Neg liver ROS, GI bleed...    Colon polyps   Endo/Other  negative endocrine ROS  Renal/GU negative Renal ROS  negative genitourinary   Musculoskeletal Right sided paresis   Abdominal Normal abdominal exam  (+)   Peds negative pediatric ROS (+)  Hematology negative hematology ROS (+)   Anesthesia Other Findings T-cell lymphoma... Brain mets  Reproductive/Obstetrics                           Anesthesia Physical Anesthesia Plan  ASA: III  Anesthesia Plan: General   Post-op Pain Management:    Induction: Intravenous  Airway Management Planned: Nasal Cannula  Additional Equipment:   Intra-op Plan:   Post-operative Plan:   Informed Consent: I have reviewed the patients History and Physical, chart, labs and discussed the procedure including the risks, benefits and alternatives for the proposed anesthesia with the patient or authorized representative who has indicated his/her understanding and acceptance.   Dental advisory given  Plan Discussed with: CRNA and Surgeon  Anesthesia Plan Comments:         Anesthesia Quick Evaluation

## 2015-09-19 NOTE — Anesthesia Procedure Notes (Signed)
Performed by: COOK-MARTIN, Tatelyn Vanhecke Pre-anesthesia Checklist: Patient identified, Emergency Drugs available, Suction available, Patient being monitored and Timeout performed Patient Re-evaluated:Patient Re-evaluated prior to inductionOxygen Delivery Method: Nasal cannula Preoxygenation: Pre-oxygenation with 100% oxygen Intubation Type: IV induction Placement Confirmation: positive ETCO2 and CO2 detector       

## 2015-09-20 LAB — SURGICAL PATHOLOGY

## 2015-09-23 ENCOUNTER — Encounter: Payer: Self-pay | Admitting: Unknown Physician Specialty

## 2016-03-09 ENCOUNTER — Ambulatory Visit
Admission: RE | Admit: 2016-03-09 | Discharge: 2016-03-09 | Disposition: A | Discharge status: Home / Self Care | Payer: Medicare Other | Source: Ambulatory Visit | Attending: Radiation Oncology | Admitting: Radiation Oncology

## 2016-03-09 ENCOUNTER — Encounter: Payer: Self-pay | Admitting: Radiation Oncology

## 2016-03-09 VITALS — BP 156/68 | HR 66 | Temp 96.7°F | Wt 210.6 lb

## 2016-03-09 DIAGNOSIS — F17210 Nicotine dependence, cigarettes, uncomplicated: Secondary | ICD-10-CM | POA: Insufficient documentation

## 2016-03-09 DIAGNOSIS — C760 Malignant neoplasm of head, face and neck: Secondary | ICD-10-CM | POA: Insufficient documentation

## 2016-03-09 DIAGNOSIS — Z85028 Personal history of other malignant neoplasm of stomach: Secondary | ICD-10-CM | POA: Insufficient documentation

## 2016-03-09 DIAGNOSIS — Z86711 Personal history of pulmonary embolism: Secondary | ICD-10-CM | POA: Insufficient documentation

## 2016-03-09 DIAGNOSIS — Z510 Encounter for antineoplastic radiation therapy: Secondary | ICD-10-CM | POA: Insufficient documentation

## 2016-03-09 DIAGNOSIS — C44321 Squamous cell carcinoma of skin of nose: Secondary | ICD-10-CM

## 2016-03-09 DIAGNOSIS — Z8701 Personal history of pneumonia (recurrent): Secondary | ICD-10-CM | POA: Insufficient documentation

## 2016-03-09 DIAGNOSIS — Z85841 Personal history of malignant neoplasm of brain: Secondary | ICD-10-CM | POA: Insufficient documentation

## 2016-03-09 DIAGNOSIS — Z79899 Other long term (current) drug therapy: Secondary | ICD-10-CM | POA: Insufficient documentation

## 2016-03-09 DIAGNOSIS — Z85828 Personal history of other malignant neoplasm of skin: Secondary | ICD-10-CM | POA: Insufficient documentation

## 2016-03-09 HISTORY — DX: Gastrointestinal stromal tumor, unspecified site: C49.A0

## 2016-03-09 HISTORY — DX: Unspecified malignant neoplasm of skin, unspecified: C44.90

## 2016-03-09 NOTE — Consult Note (Signed)
Except an outstanding is perfect of Radiation Oncology NEW PATIENT EVALUATION  Name: Aaron Rose  MRN: [id number]  Date:   03/09/2016     DOB: 23-Apr-1933   This 82 y.o. male patient presents to the clinic for initial evaluation of well-differentiated squamous cell carcinoma the nose.  REFERRING PHYSICIAN: Kirk Ruths, MD  CHIEF COMPLAINT:  Chief Complaint  Patient presents with  . Cancer    Recurring SCC to the left side of nose    DIAGNOSIS: The encounter diagnosis was Squamous cell cancer of skin of nose.   PREVIOUS INVESTIGATIONS:  Pathology report reviewed Clinical notes reviewed  HPI: Patient is an 82 year old male presents today for evaluation of a well differentiated squamous cell carcinoma of the nose. Patient is a history of multiple skin cancers cared for by dermatology in the past. Also has a history of melanoma of his forehead which has been surgically removed. Patient also has GIST surgery in the past and has had radiation therapy to his brain for involvement of lymphoma remotely. Patient is otherwise doing well.  PLANNED TREATMENT REGIMEN: Electron beam therapy  PAST MEDICAL HISTORY:  has a past medical history of Cancer ([place]); Brain cancer ([place]); Pulmonary embolus ([place]); Pneumonia; Skin cancer; and Malignant GIST (gastrointestinal stromal tumor) ([place]).    PAST SURGICAL HISTORY:  Past Surgical History  Procedure Laterality Date  . Gist surgery     . Vasectomy    . Mole removal      head  . Melonoma    . Esophagogastroduodenoscopy endoscopy    . Colonoscopy with propofol N/A 09/19/2015    Procedure: COLONOSCOPY WITH PROPOFOL;  Surgeon: Manya Silvas, MD;  Location: [facility] ENDOSCOPY;  Service: Endoscopy;  Laterality: N/A;    FAMILY HISTORY: family history is not on file.  SOCIAL HISTORY:  reports that he has been smoking.  He does not have any smokeless tobacco history on file. He reports that he drinks about 0.6 oz of alcohol per week. He reports  that he does not use illicit drugs.  ALLERGIES: Shrimp  MEDICATIONS:  Current Outpatient Prescriptions  Medication Sig Dispense Refill  . atorvastatin (LIPITOR) 10 MG tablet Take 10 mg by mouth daily.    Marland Kitchen gabapentin (NEURONTIN) 100 MG capsule Take 100 mg by mouth 3 (three) times daily.    Marland Kitchen imatinib (GLEEVEC) 100 MG tablet Take 100 mg by mouth daily. Take with meals and large glass of water.Caution:Chemotherapy    . pantoprazole (PROTONIX) 40 MG tablet Take 40 mg by mouth daily.    . pravastatin (PRAVACHOL) 20 MG tablet     . rivaroxaban (XARELTO) 20 MG TABS tablet Take by mouth.     No current facility-administered medications for this encounter.    ECOG PERFORMANCE STATUS:  0 - Asymptomatic  REVIEW OF SYSTEMS:  Patient denies any weight loss, fatigue, weakness, fever, chills or night sweats. Patient denies any loss of vision, blurred vision. Patient denies any ringing  of the ears or hearing loss. No irregular heartbeat. Patient denies heart murmur or history of fainting. Patient denies any chest pain or pain radiating to her upper extremities. Patient denies any shortness of breath, difficulty breathing at night, cough or hemoptysis. Patient denies any swelling in the lower legs. Patient denies any nausea vomiting, vomiting of blood, or coffee ground material in the vomitus. Patient denies any stomach pain. Patient states has had normal bowel movements no significant constipation or diarrhea. Patient denies any dysuria, hematuria or significant nocturia. Patient denies  any problems walking, swelling in the joints or loss of balance. Patient denies any skin changes, loss of hair or loss of weight. Patient denies any excessive worrying or anxiety or significant depression. Patient denies any problems with insomnia. Patient denies excessive thirst, polyuria, polydipsia. Patient denies any swollen glands, patient denies easy bruising or easy bleeding. Patient denies any recent infections,  allergies or URI. Patient "s visual fields have not changed significantly in recent time.    PHYSICAL EXAM: BP 156/68 mmHg  Pulse 66  Temp(Src) 96.7 F (35.9 C)  Wt 210 lb 10.4 oz (95.55 kg) Patient has approximate 1.5 cm ulcerated scab-like lesion on the tip of his nose consistent with known diagnosis of well-differentiated squamous cell carcinoma. No evidence of preauricular sub-digastric cervical or supra clavicular adenopathy is identified. Well-developed well-nourished patient in NAD. HEENT reveals PERLA, EOMI, discs not visualized.  Oral cavity is clear. No oral mucosal lesions are identified. Neck is clear without evidence of cervical or supraclavicular adenopathy. Lungs are clear to A&P. Cardiac examination is essentially unremarkable with regular rate and rhythm without murmur rub or thrill. Abdomen is benign with no organomegaly or masses noted. Motor sensory and DTR levels are equal and symmetric in the upper and lower extremities. Cranial nerves II through XII are grossly intact. Proprioception is intact. No peripheral adenopathy or edema is identified. No motor or sensory levels are noted. Crude visual fields are within normal range.  LABORATORY DATA: Pathology report reviewed    RADIOLOGY RESULTS: Patient is scheduled for chest x-ray in May   IMPRESSION: Squamous cell carcinoma the nose tip in 82 year old male  PLAN: At this time I think this would be good case for electron beam therapy. Would plan on delivering 5000 cGy over 5 weeks using electron beam treatment. Risks and benefits of treatment including erythematous changes of the nose fatigue and permanent discoloration of the skin all were discussed in detail with the patient and his family. I have personally set him up for simulation this week.  I would like to take this opportunity for allowing me to participate in the care of your patient.Armstead Peaks., MD

## 2016-03-11 ENCOUNTER — Ambulatory Visit
Admission: RE | Admit: 2016-03-11 | Discharge: 2016-03-11 | Disposition: A | Discharge status: Home / Self Care | Payer: Medicare Other | Source: Ambulatory Visit | Attending: Radiation Oncology | Admitting: Radiation Oncology

## 2016-03-11 DIAGNOSIS — Z510 Encounter for antineoplastic radiation therapy: Secondary | ICD-10-CM | POA: Diagnosis present

## 2016-03-11 DIAGNOSIS — Z79899 Other long term (current) drug therapy: Secondary | ICD-10-CM | POA: Diagnosis not present

## 2016-03-11 DIAGNOSIS — Z86711 Personal history of pulmonary embolism: Secondary | ICD-10-CM | POA: Diagnosis not present

## 2016-03-11 DIAGNOSIS — F17210 Nicotine dependence, cigarettes, uncomplicated: Secondary | ICD-10-CM | POA: Diagnosis not present

## 2016-03-11 DIAGNOSIS — Z85841 Personal history of malignant neoplasm of brain: Secondary | ICD-10-CM | POA: Diagnosis not present

## 2016-03-11 DIAGNOSIS — Z8701 Personal history of pneumonia (recurrent): Secondary | ICD-10-CM | POA: Diagnosis not present

## 2016-03-11 DIAGNOSIS — Z85828 Personal history of other malignant neoplasm of skin: Secondary | ICD-10-CM | POA: Diagnosis not present

## 2016-03-11 DIAGNOSIS — Z85028 Personal history of other malignant neoplasm of stomach: Secondary | ICD-10-CM | POA: Diagnosis not present

## 2016-03-11 DIAGNOSIS — C760 Malignant neoplasm of head, face and neck: Secondary | ICD-10-CM | POA: Diagnosis not present

## 2016-03-16 DIAGNOSIS — Z510 Encounter for antineoplastic radiation therapy: Secondary | ICD-10-CM | POA: Diagnosis not present

## 2016-03-17 ENCOUNTER — Ambulatory Visit
Admission: RE | Admit: 2016-03-17 | Discharge: 2016-03-17 | Disposition: A | Discharge status: Home / Self Care | Payer: Medicare Other | Source: Ambulatory Visit | Attending: Radiation Oncology | Admitting: Radiation Oncology

## 2016-03-17 DIAGNOSIS — Z510 Encounter for antineoplastic radiation therapy: Secondary | ICD-10-CM | POA: Diagnosis not present

## 2016-03-18 ENCOUNTER — Ambulatory Visit
Admission: RE | Admit: 2016-03-18 | Discharge: 2016-03-18 | Disposition: A | Discharge status: Home / Self Care | Payer: Medicare Other | Source: Ambulatory Visit | Attending: Radiation Oncology | Admitting: Radiation Oncology

## 2016-03-18 DIAGNOSIS — Z510 Encounter for antineoplastic radiation therapy: Secondary | ICD-10-CM | POA: Diagnosis not present

## 2016-03-19 ENCOUNTER — Ambulatory Visit
Admission: RE | Admit: 2016-03-19 | Discharge: 2016-03-19 | Disposition: A | Discharge status: Home / Self Care | Payer: Medicare Other | Source: Ambulatory Visit | Attending: Radiation Oncology | Admitting: Radiation Oncology

## 2016-03-19 DIAGNOSIS — Z510 Encounter for antineoplastic radiation therapy: Secondary | ICD-10-CM | POA: Diagnosis not present

## 2016-03-20 ENCOUNTER — Ambulatory Visit
Admission: RE | Admit: 2016-03-20 | Discharge: 2016-03-20 | Disposition: A | Discharge status: Home / Self Care | Payer: Medicare Other | Source: Ambulatory Visit | Attending: Radiation Oncology | Admitting: Radiation Oncology

## 2016-03-20 DIAGNOSIS — Z510 Encounter for antineoplastic radiation therapy: Secondary | ICD-10-CM | POA: Diagnosis not present

## 2016-03-23 ENCOUNTER — Ambulatory Visit
Admission: RE | Admit: 2016-03-23 | Discharge: 2016-03-23 | Disposition: A | Discharge status: Home / Self Care | Payer: Medicare Other | Source: Ambulatory Visit | Attending: Radiation Oncology | Admitting: Radiation Oncology

## 2016-03-23 DIAGNOSIS — Z510 Encounter for antineoplastic radiation therapy: Secondary | ICD-10-CM | POA: Diagnosis not present

## 2016-03-24 ENCOUNTER — Ambulatory Visit
Admission: RE | Admit: 2016-03-24 | Discharge: 2016-03-24 | Disposition: A | Discharge status: Home / Self Care | Payer: Medicare Other | Source: Ambulatory Visit | Attending: Radiation Oncology | Admitting: Radiation Oncology

## 2016-03-24 DIAGNOSIS — Z510 Encounter for antineoplastic radiation therapy: Secondary | ICD-10-CM | POA: Diagnosis not present

## 2016-03-25 ENCOUNTER — Ambulatory Visit
Admission: RE | Admit: 2016-03-25 | Discharge: 2016-03-25 | Disposition: A | Discharge status: Home / Self Care | Payer: Medicare Other | Source: Ambulatory Visit | Attending: Radiation Oncology | Admitting: Radiation Oncology

## 2016-03-25 DIAGNOSIS — Z510 Encounter for antineoplastic radiation therapy: Secondary | ICD-10-CM | POA: Diagnosis not present

## 2016-03-26 ENCOUNTER — Ambulatory Visit
Admission: RE | Admit: 2016-03-26 | Discharge: 2016-03-26 | Disposition: A | Discharge status: Home / Self Care | Payer: Medicare Other | Source: Ambulatory Visit | Attending: Radiation Oncology | Admitting: Radiation Oncology

## 2016-03-26 DIAGNOSIS — Z510 Encounter for antineoplastic radiation therapy: Secondary | ICD-10-CM | POA: Diagnosis not present

## 2016-03-27 ENCOUNTER — Ambulatory Visit
Admission: RE | Admit: 2016-03-27 | Discharge: 2016-03-27 | Disposition: A | Discharge status: Home / Self Care | Payer: Medicare Other | Source: Ambulatory Visit | Attending: Radiation Oncology | Admitting: Radiation Oncology

## 2016-03-27 DIAGNOSIS — Z510 Encounter for antineoplastic radiation therapy: Secondary | ICD-10-CM | POA: Diagnosis not present

## 2016-03-30 ENCOUNTER — Ambulatory Visit
Admission: RE | Admit: 2016-03-30 | Discharge: 2016-03-30 | Disposition: A | Discharge status: Home / Self Care | Payer: Medicare Other | Source: Ambulatory Visit | Attending: Radiation Oncology | Admitting: Radiation Oncology

## 2016-03-30 DIAGNOSIS — Z510 Encounter for antineoplastic radiation therapy: Secondary | ICD-10-CM | POA: Diagnosis not present

## 2016-03-31 ENCOUNTER — Ambulatory Visit
Admission: RE | Admit: 2016-03-31 | Discharge: 2016-03-31 | Disposition: A | Discharge status: Home / Self Care | Payer: Medicare Other | Source: Ambulatory Visit | Attending: Radiation Oncology | Admitting: Radiation Oncology

## 2016-03-31 DIAGNOSIS — Z510 Encounter for antineoplastic radiation therapy: Secondary | ICD-10-CM | POA: Diagnosis not present

## 2016-04-01 ENCOUNTER — Ambulatory Visit
Admission: RE | Admit: 2016-04-01 | Discharge: 2016-04-01 | Disposition: A | Discharge status: Home / Self Care | Payer: Medicare Other | Source: Ambulatory Visit | Attending: Radiation Oncology | Admitting: Radiation Oncology

## 2016-04-01 DIAGNOSIS — Z510 Encounter for antineoplastic radiation therapy: Secondary | ICD-10-CM | POA: Diagnosis not present

## 2016-04-02 ENCOUNTER — Ambulatory Visit
Admission: RE | Admit: 2016-04-02 | Discharge: 2016-04-02 | Disposition: A | Discharge status: Home / Self Care | Payer: Medicare Other | Source: Ambulatory Visit | Attending: Radiation Oncology | Admitting: Radiation Oncology

## 2016-04-02 DIAGNOSIS — Z510 Encounter for antineoplastic radiation therapy: Secondary | ICD-10-CM | POA: Diagnosis not present

## 2016-04-03 ENCOUNTER — Ambulatory Visit
Admission: RE | Admit: 2016-04-03 | Discharge: 2016-04-03 | Disposition: A | Discharge status: Home / Self Care | Payer: Medicare Other | Source: Ambulatory Visit | Attending: Radiation Oncology | Admitting: Radiation Oncology

## 2016-04-03 DIAGNOSIS — Z510 Encounter for antineoplastic radiation therapy: Secondary | ICD-10-CM | POA: Diagnosis not present

## 2016-04-07 ENCOUNTER — Ambulatory Visit
Admission: RE | Admit: 2016-04-07 | Discharge: 2016-04-07 | Disposition: A | Discharge status: Home / Self Care | Payer: Medicare Other | Source: Ambulatory Visit | Attending: Radiation Oncology | Admitting: Radiation Oncology

## 2016-04-07 DIAGNOSIS — Z510 Encounter for antineoplastic radiation therapy: Secondary | ICD-10-CM | POA: Diagnosis not present

## 2016-04-08 ENCOUNTER — Ambulatory Visit
Admission: RE | Admit: 2016-04-08 | Discharge: 2016-04-08 | Disposition: A | Discharge status: Home / Self Care | Payer: Medicare Other | Source: Ambulatory Visit | Attending: Radiation Oncology | Admitting: Radiation Oncology

## 2016-04-08 DIAGNOSIS — Z510 Encounter for antineoplastic radiation therapy: Secondary | ICD-10-CM | POA: Diagnosis not present

## 2016-04-09 ENCOUNTER — Ambulatory Visit
Admission: RE | Admit: 2016-04-09 | Discharge: 2016-04-09 | Disposition: A | Discharge status: Home / Self Care | Payer: Medicare Other | Source: Ambulatory Visit | Attending: Radiation Oncology | Admitting: Radiation Oncology

## 2016-04-09 DIAGNOSIS — Z510 Encounter for antineoplastic radiation therapy: Secondary | ICD-10-CM | POA: Diagnosis not present

## 2016-04-10 ENCOUNTER — Ambulatory Visit
Admission: RE | Admit: 2016-04-10 | Discharge: 2016-04-10 | Disposition: A | Discharge status: Home / Self Care | Payer: Medicare Other | Source: Ambulatory Visit | Attending: Radiation Oncology | Admitting: Radiation Oncology

## 2016-04-10 DIAGNOSIS — Z510 Encounter for antineoplastic radiation therapy: Secondary | ICD-10-CM | POA: Diagnosis not present

## 2016-04-13 ENCOUNTER — Ambulatory Visit
Admission: RE | Admit: 2016-04-13 | Discharge: 2016-04-13 | Disposition: A | Discharge status: Home / Self Care | Payer: Medicare Other | Source: Ambulatory Visit | Attending: Radiation Oncology | Admitting: Radiation Oncology

## 2016-04-13 DIAGNOSIS — Z510 Encounter for antineoplastic radiation therapy: Secondary | ICD-10-CM | POA: Diagnosis not present

## 2016-04-14 ENCOUNTER — Ambulatory Visit
Admission: RE | Admit: 2016-04-14 | Discharge: 2016-04-14 | Disposition: A | Discharge status: Home / Self Care | Payer: Medicare Other | Source: Ambulatory Visit | Attending: Radiation Oncology | Admitting: Radiation Oncology

## 2016-04-14 DIAGNOSIS — Z510 Encounter for antineoplastic radiation therapy: Secondary | ICD-10-CM | POA: Diagnosis not present

## 2016-04-15 ENCOUNTER — Ambulatory Visit
Admission: RE | Admit: 2016-04-15 | Discharge: 2016-04-15 | Disposition: A | Discharge status: Home / Self Care | Payer: Medicare Other | Source: Ambulatory Visit | Attending: Radiation Oncology | Admitting: Radiation Oncology

## 2016-04-15 DIAGNOSIS — Z510 Encounter for antineoplastic radiation therapy: Secondary | ICD-10-CM | POA: Diagnosis not present

## 2016-04-16 ENCOUNTER — Ambulatory Visit
Admission: RE | Admit: 2016-04-16 | Discharge: 2016-04-16 | Disposition: A | Discharge status: Home / Self Care | Payer: Medicare Other | Source: Ambulatory Visit | Attending: Radiation Oncology | Admitting: Radiation Oncology

## 2016-04-16 DIAGNOSIS — Z510 Encounter for antineoplastic radiation therapy: Secondary | ICD-10-CM | POA: Diagnosis not present

## 2016-04-17 ENCOUNTER — Ambulatory Visit
Admission: RE | Admit: 2016-04-17 | Discharge: 2016-04-17 | Disposition: A | Discharge status: Home / Self Care | Payer: Medicare Other | Source: Ambulatory Visit | Attending: Radiation Oncology | Admitting: Radiation Oncology

## 2016-04-17 DIAGNOSIS — Z510 Encounter for antineoplastic radiation therapy: Secondary | ICD-10-CM | POA: Diagnosis not present

## 2016-04-20 ENCOUNTER — Ambulatory Visit: Payer: Medicare Other

## 2016-04-21 ENCOUNTER — Ambulatory Visit: Payer: Medicare Other

## 2016-04-21 ENCOUNTER — Ambulatory Visit
Admission: RE | Admit: 2016-04-21 | Discharge: 2016-04-21 | Disposition: A | Discharge status: Home / Self Care | Payer: Medicare Other | Source: Ambulatory Visit | Attending: Radiation Oncology | Admitting: Radiation Oncology

## 2016-04-21 DIAGNOSIS — Z510 Encounter for antineoplastic radiation therapy: Secondary | ICD-10-CM | POA: Diagnosis not present

## 2016-04-22 ENCOUNTER — Ambulatory Visit
Admission: RE | Admit: 2016-04-22 | Discharge: 2016-04-22 | Disposition: A | Discharge status: Home / Self Care | Payer: Medicare Other | Source: Ambulatory Visit | Attending: Radiation Oncology | Admitting: Radiation Oncology

## 2016-04-22 DIAGNOSIS — Z510 Encounter for antineoplastic radiation therapy: Secondary | ICD-10-CM | POA: Diagnosis not present

## 2016-06-10 ENCOUNTER — Ambulatory Visit: Payer: Medicare Other | Attending: Radiation Oncology | Admitting: Radiation Oncology

## 2016-08-19 ENCOUNTER — Ambulatory Visit
Admission: RE | Admit: 2016-08-19 | Discharge: 2016-08-19 | Disposition: A | Discharge status: Home / Self Care | Payer: Medicare Other | Source: Ambulatory Visit | Attending: Radiation Oncology | Admitting: Radiation Oncology

## 2016-08-19 ENCOUNTER — Encounter: Payer: Self-pay | Admitting: Radiation Oncology

## 2016-08-19 VITALS — BP 158/85 | HR 58 | Temp 97.6°F

## 2016-08-19 DIAGNOSIS — Z029 Encounter for administrative examinations, unspecified: Secondary | ICD-10-CM | POA: Insufficient documentation

## 2016-08-19 DIAGNOSIS — C44321 Squamous cell carcinoma of skin of nose: Secondary | ICD-10-CM

## 2017-03-08 ENCOUNTER — Encounter
Admission: RE | Admit: 2017-03-08 | Discharge: 2017-03-08 | Disposition: A | Discharge status: Home / Self Care | Payer: Medicare Other | Source: Ambulatory Visit | Attending: Internal Medicine | Admitting: Internal Medicine

## 2017-03-08 DIAGNOSIS — R509 Fever, unspecified: Secondary | ICD-10-CM | POA: Diagnosis present

## 2017-03-08 LAB — INFLUENZA PANEL BY PCR (TYPE A & B)
Influenza A By PCR: NEGATIVE
Influenza B By PCR: NEGATIVE

## 2017-03-09 ENCOUNTER — Other Ambulatory Visit
Admission: RE | Admit: 2017-03-09 | Discharge: 2017-03-09 | Disposition: A | Discharge status: Home / Self Care | Payer: Medicare Other | Source: Ambulatory Visit | Attending: Gerontology | Admitting: Gerontology

## 2017-03-09 ENCOUNTER — Encounter
Admission: RE | Admit: 2017-03-09 | Discharge: 2017-03-09 | Disposition: A | Discharge status: Home / Self Care | Payer: Medicare Other | Source: Ambulatory Visit | Attending: Internal Medicine | Admitting: Internal Medicine

## 2017-03-09 DIAGNOSIS — R531 Weakness: Secondary | ICD-10-CM | POA: Insufficient documentation

## 2017-03-09 LAB — CBC WITH DIFFERENTIAL/PLATELET
BASOS ABS: 0 10*3/uL (ref 0–0.1)
BASOS PCT: 0 %
EOS PCT: 5 %
Eosinophils Absolute: 0.3 10*3/uL (ref 0–0.7)
HCT: 39.8 % — ABNORMAL LOW (ref 40.0–52.0)
Hemoglobin: 13.4 g/dL (ref 13.0–18.0)
Lymphocytes Relative: 11 %
Lymphs Abs: 0.7 10*3/uL — ABNORMAL LOW (ref 1.0–3.6)
MCH: 34.2 pg — AB (ref 26.0–34.0)
MCHC: 33.6 g/dL (ref 32.0–36.0)
MCV: 101.6 fL — AB (ref 80.0–100.0)
MONO ABS: 0.4 10*3/uL (ref 0.2–1.0)
Monocytes Relative: 7 %
Neutro Abs: 5.1 10*3/uL (ref 1.4–6.5)
Neutrophils Relative %: 77 %
Platelets: 192 10*3/uL (ref 150–440)
RBC: 3.91 MIL/uL — AB (ref 4.40–5.90)
RDW: 14.3 % (ref 11.5–14.5)
WBC: 6.6 10*3/uL (ref 3.8–10.6)

## 2017-03-09 LAB — MAGNESIUM: Magnesium: 1.6 mg/dL — ABNORMAL LOW (ref 1.7–2.4)

## 2017-03-09 LAB — COMPREHENSIVE METABOLIC PANEL
ALK PHOS: 75 U/L (ref 38–126)
ALT: 18 U/L (ref 17–63)
ANION GAP: 8 (ref 5–15)
AST: 37 U/L (ref 15–41)
Albumin: 3.4 g/dL — ABNORMAL LOW (ref 3.5–5.0)
BILIRUBIN TOTAL: 0.5 mg/dL (ref 0.3–1.2)
BUN: 28 mg/dL — ABNORMAL HIGH (ref 6–20)
CO2: 23 mmol/L (ref 22–32)
Calcium: 8.6 mg/dL — ABNORMAL LOW (ref 8.9–10.3)
Chloride: 103 mmol/L (ref 101–111)
Creatinine, Ser: 1.74 mg/dL — ABNORMAL HIGH (ref 0.61–1.24)
GFR, EST AFRICAN AMERICAN: 41 mL/min — AB (ref >60)
GFR, EST NON AFRICAN AMERICAN: 35 mL/min — AB (ref >60)
GLUCOSE: 103 mg/dL — AB (ref 65–99)
Potassium: 4.6 mmol/L (ref 3.5–5.1)
Sodium: 134 mmol/L — ABNORMAL LOW (ref 135–145)
TOTAL PROTEIN: 7.1 g/dL (ref 6.5–8.1)

## 2017-03-09 LAB — VITAMIN B12: VITAMIN B 12: 166 pg/mL — AB (ref 180–914)

## 2017-03-09 LAB — TSH: TSH: 3.406 u[IU]/mL (ref 0.350–4.500)

## 2017-03-10 LAB — VITAMIN D 25 HYDROXY (VIT D DEFICIENCY, FRACTURES): VIT D 25 HYDROXY: 24 ng/mL — AB (ref 30.0–100.0)

## 2017-03-29 ENCOUNTER — Non-Acute Institutional Stay (SKILLED_NURSING_FACILITY): Payer: Medicare Other | Admitting: Gerontology

## 2017-03-29 DIAGNOSIS — R531 Weakness: Secondary | ICD-10-CM

## 2017-03-29 DIAGNOSIS — R296 Repeated falls: Secondary | ICD-10-CM

## 2017-03-29 NOTE — Progress Notes (Signed)
Location:      Place of Service:  SNF (31) Provider:  Toni Arthurs, NP-C  Kirk Ruths, MD  Patient Care Team: Kirk Ruths, MD as PCP - General (Internal Medicine)  Extended Emergency Contact Information Primary Emergency Contact: [facility] Address: [place], [place] [address] Johnnette Litter of [place] Phone: [phone] Work Phone: [phone] Relation: Daughter Secondary Emergency Contact: Ask,Elaine Address: [address] [place]          [place], [place] [address] Johnnette Litter of [place] Phone: [phone] Mobile Phone: [phone] Relation: Spouse  Code Status:  full Goals of care: Advanced Directive information Advanced Directives 08/19/2016  Does Patient Have a Medical Advance Directive? No  Copy of Healthcare Power of Attorney in Chart? -  Would patient like information on creating a medical advance directive? No - patient declined information     Chief Complaint  Patient presents with  . Follow-up    HPI:  Pt is a 83 y.o. male seen today for medical management of chronic diseases. Pt seen today for follow up after admission for multiple falls at home and generalized weakness. Pt has been working with PT and OT. He requires extensive assistance for transfers. Sits on the side of the bed occasionally. Eating well, good appetite. Documentation of eating 75-100% at almost all meals. However, he has had a significant documented weight loss. Pt was taking a nap and somewhat lethargic at the time of the interview. Pt denies n/v/d/f/c/cp/sob/ha/abd pain/dizziness/cough/pain. VSS. No other complaints.    Past Medical History:  Diagnosis Date  . Brain cancer ([place])   . Cancer ([place])   . Malignant GIST (gastrointestinal stromal tumor) ([place])   . Pneumonia   . Pulmonary embolus ([place])   . Skin cancer    Past Surgical History:  Procedure Laterality Date  . COLONOSCOPY WITH PROPOFOL N/A 09/19/2015   Procedure:  COLONOSCOPY WITH PROPOFOL;  Surgeon: Manya Silvas, MD;  Location: [facility] ENDOSCOPY;  Service: Endoscopy;  Laterality: N/A;  . ESOPHAGOGASTRODUODENOSCOPY ENDOSCOPY    . gist surgery     . melonoma    . MOLE REMOVAL     head  . VASECTOMY      Allergies  Allergen Reactions  . Shrimp [Shellfish Allergy]     Allergies as of 03/29/2017      Reactions   Shrimp [shellfish Allergy]       Medication List       Accurate as of 03/29/17  2:46 PM. Always use your most recent med list.          atorvastatin 10 MG tablet Commonly known as:  LIPITOR Take 10 mg by mouth daily.   gabapentin 100 MG capsule Commonly known as:  NEURONTIN Take 100 mg by mouth 3 (three) times daily.   imatinib 100 MG tablet Commonly known as:  GLEEVEC Take 100 mg by mouth daily. Take with meals and large glass of water.Caution:Chemotherapy   pantoprazole 40 MG tablet Commonly known as:  PROTONIX Take 40 mg by mouth daily.   pravastatin 20 MG tablet Commonly known as:  PRAVACHOL   XARELTO 20 MG Tabs tablet Generic drug:  rivaroxaban Take by mouth.       Review of Systems  Constitutional: Positive for activity change and fatigue. Negative for appetite change, chills, diaphoresis and fever.  HENT: Negative for congestion, sneezing, sore throat, trouble swallowing and voice change.   Respiratory: Negative for apnea, cough,  choking, chest tightness, shortness of breath and wheezing.   Cardiovascular: Negative for chest pain, palpitations and leg swelling.  Gastrointestinal: Negative for abdominal distention, abdominal pain, constipation, diarrhea and nausea.  Genitourinary: Negative for difficulty urinating, dysuria, frequency and urgency.  Musculoskeletal: Positive for arthralgias (typical arthritis). Negative for back pain, gait problem and myalgias.  Skin: Negative for color change, pallor, rash and wound.  Neurological: Positive for weakness. Negative for dizziness, tremors, syncope, speech  difficulty, numbness and headaches.  Psychiatric/Behavioral: Negative for agitation and behavioral problems.  All other systems reviewed and are negative.    There is no immunization history on file for this patient. Pertinent  Health Maintenance Due  Topic Date Due  . PNA vac Low Risk Adult (1 of 2 - PCV13) 09/14/2000  . INFLUENZA VACCINE  06/09/2017   Fall Risk  08/19/2016  Falls in the past year? No   Functional Status Survey:    Vitals:   03/11/17 2330 03/23/17 1500  BP: (!) 167/89   Pulse: 62   Resp: 16   Temp: 97.5 F (36.4 C)   SpO2: 91%   Weight: 200 lb 1.6 oz (90.8 kg) 172 lb 3.2 oz (78.1 kg)   Body mass index is 22.72 kg/m. Physical Exam  Constitutional: He is oriented to person, place, and time. Vital signs are normal. He appears well-developed and well-nourished. He appears lethargic. He is active and cooperative. He does not appear ill. No distress.  HENT:  Head: Normocephalic and atraumatic.  Mouth/Throat: Uvula is midline, oropharynx is clear and moist and mucous membranes are normal. Mucous membranes are not pale, not dry and not cyanotic.  Eyes: Conjunctivae, EOM and lids are normal. Pupils are equal, round, and reactive to light.  Neck: Trachea normal, normal range of motion and full passive range of motion without pain. Neck supple. No JVD present. No tracheal deviation, no edema and no erythema present. No thyromegaly present.  Cardiovascular: Normal rate, regular rhythm, normal heart sounds, intact distal pulses and normal pulses.  Exam reveals no gallop, no distant heart sounds and no friction rub.   No murmur heard. Pulmonary/Chest: Effort normal and breath sounds normal. No accessory muscle usage. No respiratory distress. He has no wheezes. He has no rales. He exhibits no tenderness.  Abdominal: Normal appearance and bowel sounds are normal. He exhibits no distension and no ascites. There is no tenderness.  Musculoskeletal: Normal range of motion. He  exhibits no edema or tenderness.  Expected osteoarthritis, stiffness  Neurological: He is oriented to person, place, and time. He has normal strength. He appears lethargic.  Skin: Skin is warm, dry and intact. No rash noted. He is not diaphoretic. No cyanosis or erythema. No pallor. Nails show no clubbing.  Psychiatric: He has a normal mood and affect. His speech is normal and behavior is normal. Judgment and thought content normal. Cognition and memory are normal.  Nursing note and vitals reviewed.   Labs reviewed:  Recent Labs  03/09/17 1630  NA 134*  K 4.6  CL 103  CO2 23  GLUCOSE 103*  BUN 28*  CREATININE 1.74*  CALCIUM 8.6*  MG 1.6*    Recent Labs  03/09/17 1630  AST 37  ALT 18  ALKPHOS 75  BILITOT 0.5  PROT 7.1  ALBUMIN 3.4*    Recent Labs  03/09/17 1630  WBC 6.6  NEUTROABS 5.1  HGB 13.4  HCT 39.8*  MCV 101.6*  PLT 192   Lab Results  Component Value Date  TSH 3.406 03/09/2017   No results found for: HGBA1C Lab Results  Component Value Date   CHOL 125 04/18/2013   HDL 26 (L) 04/18/2013   LDLCALC 70 04/18/2013   TRIG 143 04/18/2013    Significant Diagnostic Results in last 30 days:  No results found.  Assessment/Plan 1. Weakness generalized 2. Multiple falls  Continue PT/OT  Call for assistance with mobility  Care plan meeting tomorrow to discuss disposition   Needs caregivers at home upon discharge  Palliative Care Consult if family agreeable  If agreeable, Palliative Care vs Hospice to follow pt while in the home after discharge   Family/ staff Communication:   Total Time:  Documentation:  Face to Face:  Family/Phone:   Labs/tests ordered:    Medication list reviewed and assessed for continued appropriateness. Monthly medication orders reviewed and signed.  Vikki Ports, NP-C Geriatrics [facility] [phone], [place] [address] Cell Phone (Mon-Fri 8am-5pm):   [phone] On Call:  [phone] & follow prompts after 5pm & weekends Office Phone:  [phone] Office Fax:  [phone]

## 2018-08-09 DEATH — deceased
